# Patient Record
Sex: Female | Born: 1955 | Race: Black or African American | Hispanic: No | Marital: Married | Smoking: Former smoker
Health system: Southern US, Community
[De-identification: ages and names within clinical notes are randomized; demographics above are authoritative.]

## PROBLEM LIST (undated history)

## (undated) DIAGNOSIS — C801 Malignant (primary) neoplasm, unspecified: Secondary | ICD-10-CM

## (undated) DIAGNOSIS — N289 Disorder of kidney and ureter, unspecified: Secondary | ICD-10-CM

## (undated) DIAGNOSIS — C50919 Malignant neoplasm of unspecified site of unspecified female breast: Secondary | ICD-10-CM

## (undated) DIAGNOSIS — K219 Gastro-esophageal reflux disease without esophagitis: Secondary | ICD-10-CM

## (undated) DIAGNOSIS — Z923 Personal history of irradiation: Secondary | ICD-10-CM

## (undated) DIAGNOSIS — G4730 Sleep apnea, unspecified: Secondary | ICD-10-CM

## (undated) HISTORY — PX: MASTECTOMY PARTIAL / LUMPECTOMY: SUR851

## (undated) HISTORY — DX: Malignant neoplasm of unspecified site of unspecified female breast: C50.919

## (undated) HISTORY — DX: Personal history of irradiation: Z92.3

## (undated) HISTORY — PX: LAPAROSCOPIC GASTRIC BANDING: SHX1100

## (undated) HISTORY — PX: COLONOSCOPY: SHX174

---

## 1997-05-21 ENCOUNTER — Emergency Department (HOSPITAL_COMMUNITY): Admission: EM | Admit: 1997-05-21 | Discharge: 1997-05-21 | Payer: Self-pay | Admitting: Emergency Medicine

## 1999-08-23 ENCOUNTER — Other Ambulatory Visit: Admission: RE | Admit: 1999-08-23 | Discharge: 1999-08-23 | Payer: Self-pay | Admitting: Obstetrics and Gynecology

## 1999-09-14 ENCOUNTER — Encounter: Admission: RE | Admit: 1999-09-14 | Discharge: 1999-12-13 | Payer: Self-pay | Admitting: Obstetrics and Gynecology

## 2000-09-28 ENCOUNTER — Other Ambulatory Visit: Admission: RE | Admit: 2000-09-28 | Discharge: 2000-09-28 | Payer: Self-pay | Admitting: Obstetrics and Gynecology

## 2001-10-11 ENCOUNTER — Other Ambulatory Visit: Admission: RE | Admit: 2001-10-11 | Discharge: 2001-10-11 | Payer: Self-pay | Admitting: Obstetrics and Gynecology

## 2003-05-01 ENCOUNTER — Other Ambulatory Visit: Admission: RE | Admit: 2003-05-01 | Discharge: 2003-05-01 | Payer: Self-pay | Admitting: Obstetrics and Gynecology

## 2004-01-08 ENCOUNTER — Ambulatory Visit (HOSPITAL_COMMUNITY): Admission: RE | Admit: 2004-01-08 | Discharge: 2004-01-08 | Payer: Self-pay | Admitting: General Surgery

## 2004-01-13 ENCOUNTER — Encounter: Admission: RE | Admit: 2004-01-13 | Discharge: 2004-04-12 | Payer: Self-pay | Admitting: General Surgery

## 2004-06-02 ENCOUNTER — Other Ambulatory Visit: Admission: RE | Admit: 2004-06-02 | Discharge: 2004-06-02 | Payer: Self-pay | Admitting: Obstetrics and Gynecology

## 2004-06-10 ENCOUNTER — Encounter: Admission: RE | Admit: 2004-06-10 | Discharge: 2004-09-08 | Payer: Self-pay | Admitting: General Surgery

## 2004-06-17 ENCOUNTER — Ambulatory Visit: Payer: Self-pay | Admitting: Internal Medicine

## 2004-07-08 ENCOUNTER — Ambulatory Visit: Payer: Self-pay | Admitting: Internal Medicine

## 2004-07-25 ENCOUNTER — Inpatient Hospital Stay (HOSPITAL_COMMUNITY): Admission: RE | Admit: 2004-07-25 | Discharge: 2004-07-28 | Payer: Self-pay | Admitting: General Surgery

## 2004-10-21 ENCOUNTER — Encounter: Admission: RE | Admit: 2004-10-21 | Discharge: 2005-01-15 | Payer: Self-pay | Admitting: General Surgery

## 2005-02-27 ENCOUNTER — Encounter: Admission: RE | Admit: 2005-02-27 | Discharge: 2005-05-28 | Payer: Self-pay | Admitting: General Surgery

## 2005-10-27 ENCOUNTER — Other Ambulatory Visit: Admission: RE | Admit: 2005-10-27 | Discharge: 2005-10-27 | Payer: Self-pay | Admitting: Obstetrics and Gynecology

## 2009-06-29 ENCOUNTER — Encounter (INDEPENDENT_AMBULATORY_CARE_PROVIDER_SITE_OTHER): Payer: Self-pay | Admitting: *Deleted

## 2010-02-06 ENCOUNTER — Encounter: Payer: Self-pay | Admitting: General Surgery

## 2010-02-17 NOTE — Letter (Signed)
Summary: Colonoscopy Letter  [facility]  [address] [place], [place] [address]   Phone: [phone]  Fax: [phone]      June 29, 2009 MRN: [id number]   [facility] [address] [place], [place]  [address]   Dear Ms. Davidow,   According to your medical record, it is time for you to schedule a Colonoscopy. The American Cancer Society recommends this procedure as a method to detect early colon cancer. Patients with a family history of colon cancer, or a personal history of colon polyps or inflammatory bowel disease are at increased risk.  This letter has been generated based on the recommendations made at the time of your procedure. If you feel that in your particular situation this may no longer apply, please contact our office.  Please call our office at [phone] to schedule this appointment or to update your records at your earliest convenience.  Thank you for cooperating with [place] to provide you with the very best care possible.   Sincerely,  Wilhemina Bonito. Marina Goodell, M.D.  [facility] [phone]

## 2010-10-07 ENCOUNTER — Encounter: Payer: Self-pay | Admitting: Internal Medicine

## 2010-11-10 ENCOUNTER — Other Ambulatory Visit: Payer: Self-pay | Admitting: Internal Medicine

## 2011-02-27 ENCOUNTER — Telehealth: Payer: Self-pay | Admitting: *Deleted

## 2011-02-27 NOTE — Telephone Encounter (Signed)
I called the patient and asked her if she wanted to reschedule the colonoscopy . She had one scheduled for 11-10-2010 with Dr. Marina Goodell.  She said she had the colonoscopy done in December, 2012.  I looked in our system and didn't see it.  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   I asked her the doctor's name and she said thank you and hung up.

## 2011-04-14 ENCOUNTER — Telehealth: Payer: Self-pay | Admitting: *Deleted

## 2011-04-14 NOTE — Telephone Encounter (Signed)
Called patient to schedule an appt w/ Dr. Welton Flakes and she states that she is scheduled for another Lumpectomy on 4/10 and we need to wait.  She also stated that she has [employer] and it requires approval to come here.  Told pt that I would get with our Financial Counseling Dept about the ins. And I would call her after 4/10 to schedule her Med Onc appt.

## 2011-04-17 DIAGNOSIS — C50919 Malignant neoplasm of unspecified site of unspecified female breast: Secondary | ICD-10-CM

## 2011-04-17 HISTORY — DX: Malignant neoplasm of unspecified site of unspecified female breast: C50.919

## 2011-04-24 ENCOUNTER — Encounter: Payer: Self-pay | Admitting: Oncology

## 2011-04-24 NOTE — Progress Notes (Signed)
I spoke with Florentina Addison at [facility] on Friday and she told me that we would have to go through the patient primary car doctor to get the referral for her to come her,so I talk with Burna Mortimer at Dr,[place] office and she said she sent in the referral for the patient ,but when it came back the said she would only be cover at the hematology in [place],and also I spoke with the patient to let her know what's was going on.

## 2011-05-15 ENCOUNTER — Ambulatory Visit
Admission: RE | Admit: 2011-05-15 | Discharge: 2011-05-15 | Disposition: A | Discharge status: Home / Self Care | Source: Ambulatory Visit | Attending: Radiation Oncology | Admitting: Radiation Oncology

## 2011-05-15 ENCOUNTER — Ambulatory Visit

## 2011-05-15 ENCOUNTER — Encounter: Payer: Self-pay | Admitting: Radiation Oncology

## 2011-05-15 ENCOUNTER — Ambulatory Visit: Admitting: Radiation Oncology

## 2011-05-15 VITALS — BP 145/82 | HR 70 | Temp 98.9°F | Resp 18 | Ht 69.0 in | Wt 269.6 lb

## 2011-05-15 DIAGNOSIS — G4730 Sleep apnea, unspecified: Secondary | ICD-10-CM | POA: Insufficient documentation

## 2011-05-15 DIAGNOSIS — C50119 Malignant neoplasm of central portion of unspecified female breast: Secondary | ICD-10-CM

## 2011-05-15 DIAGNOSIS — K219 Gastro-esophageal reflux disease without esophagitis: Secondary | ICD-10-CM | POA: Insufficient documentation

## 2011-05-15 DIAGNOSIS — Z87891 Personal history of nicotine dependence: Secondary | ICD-10-CM | POA: Insufficient documentation

## 2011-05-15 DIAGNOSIS — C50419 Malignant neoplasm of upper-outer quadrant of unspecified female breast: Secondary | ICD-10-CM | POA: Insufficient documentation

## 2011-05-15 DIAGNOSIS — D0590 Unspecified type of carcinoma in situ of unspecified breast: Secondary | ICD-10-CM | POA: Insufficient documentation

## 2011-05-15 DIAGNOSIS — Z510 Encounter for antineoplastic radiation therapy: Secondary | ICD-10-CM | POA: Insufficient documentation

## 2011-05-15 DIAGNOSIS — L988 Other specified disorders of the skin and subcutaneous tissue: Secondary | ICD-10-CM | POA: Insufficient documentation

## 2011-05-15 DIAGNOSIS — D0511 Intraductal carcinoma in situ of right breast: Secondary | ICD-10-CM

## 2011-05-15 DIAGNOSIS — Z9884 Bariatric surgery status: Secondary | ICD-10-CM | POA: Insufficient documentation

## 2011-05-15 HISTORY — DX: Gastro-esophageal reflux disease without esophagitis: K21.9

## 2011-05-15 HISTORY — DX: Sleep apnea, unspecified: G47.30

## 2011-05-15 HISTORY — DX: Malignant (primary) neoplasm, unspecified: C80.1

## 2011-05-15 NOTE — Progress Notes (Signed)
Patient for consultation of newly diagnosed right breast (dcis) with comedo necrosis.Patient is ERPR+.Menarche at age 56, first child age 47.No HRT replacement, menopause at age 57.

## 2011-05-15 NOTE — Progress Notes (Signed)
Radiation Oncology         [phone]) [phone] ________________________________  Initial outpatient Consultation  Name: Toni Randolph MRN: [id number]  Date: 05/15/2011  DOB: [birth year]  CC:No primary provider on file.  Tresa Res., MD   REFERRING PHYSICIAN: Tresa Res., MD  DIAGNOSIS: High Grade Intraductal Right Breast Cancer  HISTORY OF PRESENT ILLNESS:                                                                                                                                                                                                                                                                                                                                     :Toni Randolph is a 56 y.o. female who is seen courtesy of Dr. Allyne Gee for an opinion concerning radiation therapy as part of management of the patient's right intraductal breast cancer. Earlier this year on screening mammography the patient was noted to have some calcifications within the central aspect of the right breast. 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 The patient's initial biopsy specimen did show solid and comedo subtypes associated cost calcifications. The tumor was high to intermediate grade. This initial biopsy was performed in [facility]. This did have a MRI which revealed a 2.2 x 1.6 x 3.5 cm centimeter area of non mass enhancement. This patient was seen by Dr. Burnis Kingfisher at [facility] for consultation. She eventually underwent surgery with Dr. Dorette Grate.  on March 25 patient underwent a right partial mastectomy and sentinel node procedure. in the [facility] area where her husband works. 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 The patient's sentinel node showed no evidence of malignancy. The surgical margins were close at less than 0.5 mm from the inked margin. She subsequently underwent a  reexcision April 12 showing no residual disease within the  right operative site.  Given the patient's residence in the [facility] area she wishes to be treated at [place].  PREVIOUS RADIATION THERAPY: No  PAST MEDICAL HISTORY:  has a past medical history of GERD (gastroesophageal reflux disease); Cancer; and Sleep apnea.    PAST SURGICAL HISTORY: Past Surgical History  Procedure Date  . Colonoscopy   . Laparoscopic gastric banding   . Mastectomy partial / lumpectomy     FAMILY HISTORY: family history includes Coronary artery disease in an unspecified family member and Diabetes in an unspecified family member.  SOCIAL HISTORY:  reports that she quit smoking about 30 years ago. Her smoking use included Cigarettes. She smoked .2 packs per day. She has never used smokeless tobacco. She reports that she does not drink alcohol or use illicit drugs.  ALLERGIES: Codeine  MEDICATIONS:  Current Outpatient Prescriptions  Medication Sig Dispense Refill  . dexlansoprazole (DEXILANT) 60 MG capsule Take 60 mg by mouth daily.        REVIEW OF SYSTEMS:  A 15 point review of systems is documented in the electronic medical record. This was obtained by the nursing staff. However, I reviewed this with the patient to discuss relevant findings and make appropriate changes.  She does complain of some discomfort in the right breast since her reexcision.   PHYSICAL EXAM:  height is 5\' 9"  (1.753 m) and weight is 269 lb 9.6 oz (122.29 kg). Her oral temperature is 98.9 F (37.2 C). Her blood pressure is 145/82 and her pulse is 70. Her respiration is 18.   Examination of the pupils reveals him to be equal round and react to light. The extraocular movements are intact. The tongue is midline. There is no secondary infection noted in the oral cavity or posterior pharynx. Patient's teeth are in good repair. Examination of the neck and supraclavicular region reveals no evidence of adenopathy. Axillary areas are free  of adenopathy. The  lungs reveals them to be clear. The heart has a regular rhythm and rate. Examination of the left breast reveals that the large and somewhat pendulous without mass or nipple discharge. Examination of right breast with some bruising laterally. Patient has a well healing scar in the lateral aspect of the breast this is located in the 8:30 to 9:00 position of the breast. Examination the abdomen reveals to be soft and nontender with normal bowel sounds. Patient has a small scar from her previous  lap band procedure.  Motor strength is 5 out of 5 in the proximal and distal muscle groups of the upper and lower extremities. Peripheral pulses are good. There is mild edema in the ankle and foot areas. There is no appreciable swelling in the right arm.  LABORATORY DATA:  No results found for this basename: WBC, HGB, HCT, MCV, PLT   No results found for this basename: NA, K, CL, CO2   No results found for this basename: ALT, AST, GGT, ALKPHOS, BILITOT     RADIOGRAPHY: No results found.    IMPRESSION: High-grade intraductal carcinoma of the right breast. Patient would be a good candidate for breast conserving therapy with radiation therapy directed to the right breast. I discussed the overall treatment course side effects and potential toxicities of radiation therapy in the situation with Mrs. Derryberry. She appears to understand and wishes to proceed with planned course of treatment. Anticipate a dose of approximately 5040 cGy directed the right breast. Given the negative reexcision the patient will not require a boost field.  PLAN: Simulation and planning will occur on May 6 with treatment to begin the week of May 13. This should allow adequate time for healing from her recent reexcision.  I spent 60 minutes minutes face to face with the patient and more than 50% of that time was spent in counseling and/or coordination of care.   ------------------------------------------------  Billie Lade, M.D.

## 2011-05-15 NOTE — Progress Notes (Signed)
HERE TODAY FOR CONSULT RIGHT BREAST.  CONTINUES TO HAVE DISCOMFORT AT OP SITE, THE SHARPE , SHOOTING PAINS.

## 2011-05-17 ENCOUNTER — Ambulatory Visit
Admission: RE | Admit: 2011-05-17 | Discharge: 2011-05-17 | Disposition: A | Discharge status: Home / Self Care | Source: Ambulatory Visit | Attending: Radiation Oncology | Admitting: Radiation Oncology

## 2011-05-17 DIAGNOSIS — D0511 Intraductal carcinoma in situ of right breast: Secondary | ICD-10-CM

## 2011-05-17 NOTE — Progress Notes (Signed)
  Radiation Oncology         [phone] ________________________________  Name: Toni Randolph MRN: [id number]  Date: 05/17/2011  DOB: 03/21/1955  SIMULATION AND TREATMENT PLANNING NOTE  DIAGNOSIS:  Intraductal right breast cancer  NARRATIVE:  The patient was brought to the CT Simulation planning suite.  Identity was confirmed.  All relevant records and images related to the planned course of therapy were reviewed.  The patient freely provided informed written consent to proceed with treatment after reviewing the details related to the planned course of therapy. The consent form was witnessed and verified by the simulation staff.  Then, the patient was set-up in a stable reproducible  supine position for radiation therapy.  CT images were obtained.  Surface markings were placed.  The CT images were loaded into the planning software.  Then the target and avoidance structures were contoured.  Treatment planning then occurred.  The radiation prescription was entered and confirmed.  A total of 3 complex treatment devices were fabricated (custom neck and 2 MLC's). I have requested : Isodose Plan.    PLAN:  The patient will receive 50.4 Gy in 28 fractions.  -----------------------------------  Billie Lade, PhD, MD

## 2011-05-17 NOTE — Progress Notes (Signed)
Met with patient to discuss RO billing.  Gave patient several handouts and discuss pre-cert for [facility].  Patient had no other concerns today.

## 2011-05-19 ENCOUNTER — Encounter: Payer: Self-pay | Admitting: *Deleted

## 2011-05-19 ENCOUNTER — Encounter: Admitting: *Deleted

## 2011-05-22 ENCOUNTER — Other Ambulatory Visit: Payer: Self-pay | Admitting: *Deleted

## 2011-05-22 ENCOUNTER — Other Ambulatory Visit: Payer: Self-pay | Admitting: Radiation Oncology

## 2011-05-22 DIAGNOSIS — C50919 Malignant neoplasm of unspecified site of unspecified female breast: Secondary | ICD-10-CM

## 2011-05-24 ENCOUNTER — Ambulatory Visit: Admitting: Radiation Oncology

## 2011-05-24 ENCOUNTER — Other Ambulatory Visit: Admitting: Lab

## 2011-05-24 DIAGNOSIS — C50919 Malignant neoplasm of unspecified site of unspecified female breast: Secondary | ICD-10-CM

## 2011-05-24 LAB — RESEARCH LABS

## 2011-05-26 ENCOUNTER — Ambulatory Visit
Admission: RE | Admit: 2011-05-26 | Discharge: 2011-05-26 | Disposition: A | Discharge status: Home / Self Care | Source: Ambulatory Visit | Attending: Radiation Oncology | Admitting: Radiation Oncology

## 2011-05-26 ENCOUNTER — Encounter: Payer: Self-pay | Admitting: Radiation Oncology

## 2011-05-26 ENCOUNTER — Encounter: Payer: Self-pay | Admitting: *Deleted

## 2011-05-26 NOTE — Progress Notes (Unsigned)
05/26/11 @ 0815, [id number], Baseline Photos and Questionnaires:  Toni Randolph into the [facility], Radiation Oncology Dept for simulation prior to starting her radiation treatment to the right breast.  She returned all of her completed questionnaires and we took photos of the three required views.  She had research labs collected on 05/24/11.  Confirmed with Toni Randolph that she has not had BRACA testing done at any time

## 2011-05-29 NOTE — Progress Notes (Signed)
  Radiation Oncology         [phone] ________________________________  Name: Toni Randolph MRN: [id number]  Date: 05/26/2011  DOB: 10/09/1955  Simulation Verification Note  Status: outpatient  NARRATIVE: The patient was brought to the treatment unit and placed in the planned treatment position. The clinical setup was verified. Then port films were obtained and uploaded to the radiation oncology medical record software.  The treatment beams were carefully compared against the planned radiation fields. The position location and shape of the radiation fields was reviewed. They targeted volume of tissue appears to be appropriately covered by the radiation beams. Organs at risk appear to be excluded as planned.  Based on my personal review, I approved the simulation verification. The patient's treatment to the breast will proceed as planned.  ------------------------------------------------  Artist Pais Kathrynn Running, M.D.

## 2011-06-01 ENCOUNTER — Ambulatory Visit

## 2011-06-02 ENCOUNTER — Ambulatory Visit

## 2011-06-05 ENCOUNTER — Ambulatory Visit
Admission: RE | Admit: 2011-06-05 | Discharge: 2011-06-05 | Disposition: A | Discharge status: Home / Self Care | Source: Ambulatory Visit | Attending: Radiation Oncology | Admitting: Radiation Oncology

## 2011-06-05 ENCOUNTER — Ambulatory Visit

## 2011-06-05 DIAGNOSIS — D0511 Intraductal carcinoma in situ of right breast: Secondary | ICD-10-CM

## 2011-06-05 NOTE — Progress Notes (Signed)
HERE TODAY FOR PUT , FIRST TREATMENT.  SKIN LOOKS GOOD

## 2011-06-05 NOTE — Progress Notes (Signed)
   Department of Radiation Oncology  Phone:  [phone] Fax:        [phone]   Weekly Management Note Current Dose: 1.8  Gy  Projected Dose: 50.4  Gy   Narrative:  The patient presents for routine under treatment assessment.  Port film x-rays were reviewed.  The chart was checked.  She is tolerating treatments well without side effects at this point.  Physical Findings: The lungs are clear. The heart has a regular rhythm and rate. Examination of right breast reveals no appreciable radiation reaction at this point. There are no signs of infection in the breast.   Impression:  The patient is tolerating radiation.  Plan:  Continue treatment as planned.   -----------------------------------  Billie Lade, PhD, MD

## 2011-06-06 ENCOUNTER — Ambulatory Visit
Admission: RE | Admit: 2011-06-06 | Discharge: 2011-06-06 | Disposition: A | Discharge status: Home / Self Care | Source: Ambulatory Visit | Attending: Radiation Oncology | Admitting: Radiation Oncology

## 2011-06-07 ENCOUNTER — Ambulatory Visit
Admission: RE | Admit: 2011-06-07 | Discharge: 2011-06-07 | Disposition: A | Discharge status: Home / Self Care | Source: Ambulatory Visit | Attending: Radiation Oncology | Admitting: Radiation Oncology

## 2011-06-07 ENCOUNTER — Ambulatory Visit
Admission: RE | Admit: 2011-06-07 | Discharge: 2011-06-07 | Source: Ambulatory Visit | Attending: Radiation Oncology | Admitting: Radiation Oncology

## 2011-06-07 DIAGNOSIS — D0511 Intraductal carcinoma in situ of right breast: Secondary | ICD-10-CM

## 2011-06-07 NOTE — Progress Notes (Signed)
Encounter addended by: Tessa Lerner, RN on: 06/07/2011 10:10 AM<BR>     Documentation filed: Charges VN

## 2011-06-07 NOTE — Progress Notes (Signed)
Encounter addended by: Gerlene Burdock on: 06/07/2011  8:56 AM<BR>     Documentation filed: Charges VN

## 2011-06-08 ENCOUNTER — Ambulatory Visit
Admission: RE | Admit: 2011-06-08 | Discharge: 2011-06-08 | Disposition: A | Discharge status: Home / Self Care | Source: Ambulatory Visit | Attending: Radiation Oncology | Admitting: Radiation Oncology

## 2011-06-08 MED ORDER — ALRA NON-METALLIC DEODORANT (RAD-ONC): 1.0000 "application " | Freq: Once | TOPICAL | Status: DC

## 2011-06-08 MED ORDER — RADIAPLEXRX EX GEL: Freq: Once | CUTANEOUS | Status: DC

## 2011-06-08 NOTE — Progress Notes (Signed)
Encounter addended by: Amanda Pea, RN on: 06/08/2011 12:43 PM<BR>     Documentation filed: Visit Diagnoses

## 2011-06-08 NOTE — Progress Notes (Signed)
TEACHING DONE WITH PATIENT REGARDING RADIATION TX FOR BREAST CANCER.  PATIENT ABLE TO REPEAT TO ME WHAT I TALKED TO HER ABOUT.  RADIAPLEX AND ALRA DEODORANT GIVEN WITH INSTRUCTIONS FOR USE.  BOOKLET, RADIATION AND YOU GIVEN TO PT WITH PERTINENT INFO MARKED.

## 2011-06-09 ENCOUNTER — Ambulatory Visit

## 2011-06-13 ENCOUNTER — Ambulatory Visit: Admitting: Radiation Oncology

## 2011-06-13 ENCOUNTER — Ambulatory Visit
Admission: RE | Admit: 2011-06-13 | Discharge: 2011-06-13 | Disposition: A | Discharge status: Home / Self Care | Source: Ambulatory Visit | Attending: Radiation Oncology | Admitting: Radiation Oncology

## 2011-06-14 ENCOUNTER — Ambulatory Visit
Admission: RE | Admit: 2011-06-14 | Discharge: 2011-06-14 | Disposition: A | Discharge status: Home / Self Care | Source: Ambulatory Visit | Admitting: Radiation Oncology

## 2011-06-14 ENCOUNTER — Encounter: Payer: Self-pay | Admitting: Radiation Oncology

## 2011-06-14 ENCOUNTER — Ambulatory Visit
Admission: RE | Admit: 2011-06-14 | Discharge: 2011-06-14 | Disposition: A | Discharge status: Home / Self Care | Source: Ambulatory Visit | Attending: Radiation Oncology | Admitting: Radiation Oncology

## 2011-06-14 VITALS — BP 127/77 | HR 66 | Resp 20 | Wt 272.7 lb

## 2011-06-14 DIAGNOSIS — D0511 Intraductal carcinoma in situ of right breast: Secondary | ICD-10-CM

## 2011-06-14 NOTE — Progress Notes (Signed)
Pt reports fatigue, no loss of appetite or pain. Applying Radiaplex to right breast.  Pt noticed swelling in left lower arm x 1 week. No history. Denies pain , tingling in left arm, does states she "felt like something was crawling on inner arm".

## 2011-06-14 NOTE — Progress Notes (Signed)
   Department of Radiation Oncology  Phone:  [phone] Fax:        [phone]   Weekly Management Note Current Dose: 10.8  Gy  Projected Dose: 50.4  Gy   Narrative:  The patient presents for routine under treatment assessment.  Port film x-rays were reviewed.  The chart was checked.  She has noticed some mild fatigue. She denies any itching or discomfort in the breast area.  Physical Findings: Weight: 272 lb 11.2 oz (123.696 kg). The lungs are clear. The heart has a regular rhythm and rate. Examination of the right breast reveals no appreciable radiation reaction at this point.  Impression:  The patient is tolerating radiation.  Plan:  Continue treatment as planned.   -----------------------------------  Billie Lade, PhD, MD

## 2011-06-15 ENCOUNTER — Ambulatory Visit
Admission: RE | Admit: 2011-06-15 | Discharge: 2011-06-15 | Disposition: A | Discharge status: Home / Self Care | Source: Ambulatory Visit | Attending: Radiation Oncology | Admitting: Radiation Oncology

## 2011-06-16 ENCOUNTER — Ambulatory Visit

## 2011-06-16 NOTE — Progress Notes (Signed)
Encounter addended by: Delynn Flavin, RN on: 06/16/2011  4:43 PM<BR>     Documentation filed: Visit Diagnoses

## 2011-06-19 ENCOUNTER — Ambulatory Visit
Admission: RE | Admit: 2011-06-19 | Discharge: 2011-06-19 | Disposition: A | Discharge status: Home / Self Care | Source: Ambulatory Visit | Attending: Radiation Oncology | Admitting: Radiation Oncology

## 2011-06-20 ENCOUNTER — Ambulatory Visit
Admission: RE | Admit: 2011-06-20 | Discharge: 2011-06-20 | Disposition: A | Discharge status: Home / Self Care | Source: Ambulatory Visit | Attending: Radiation Oncology | Admitting: Radiation Oncology

## 2011-06-21 ENCOUNTER — Ambulatory Visit: Admission: RE | Admit: 2011-06-21 | Discharge: 2011-06-21 | Source: Ambulatory Visit | Admitting: Radiation Oncology

## 2011-06-21 ENCOUNTER — Ambulatory Visit
Admission: RE | Admit: 2011-06-21 | Discharge: 2011-06-21 | Disposition: A | Discharge status: Home / Self Care | Source: Ambulatory Visit | Attending: Radiation Oncology | Admitting: Radiation Oncology

## 2011-06-21 DIAGNOSIS — D0511 Intraductal carcinoma in situ of right breast: Secondary | ICD-10-CM

## 2011-06-21 MED ORDER — LORAZEPAM 0.5 MG PO TABS: 0.5000 mg | ORAL_TABLET | Freq: Three times a day (TID) | ORAL | Status: AC

## 2011-06-21 NOTE — Progress Notes (Signed)
Weekly Management Note Current Dose:  18.0 Gy  Projected Dose: 50.4  Gy   Narrative:  The patient presents for routine under treatment assessment. Port film x-rays were reviewed.  The chart was checked. She has noticed some mild fatigue but continues to work her usual schedule. Patient denies any significant itching or discomfort in the breast. She does have some discomfort in the neck area with her position for treatment. This is  bothering her up into the evening hours making sleeping difficult. I suggested a possible muscle relaxant. The patient would like to try Ativan to see if this will help.   Physical Findings: The lungs are clear. The heart has a regular rhythm and rate. Examination of the right breast area reveals some slight hyperpigmentation changes and mild erythema  in the inframammary fold.  Impression:  The patient is tolerating radiation well except for positional issues as above. Patient will try Ativan as above see if this will help with her neck discomfort.  Plan:  Continue treatment as planned.   -----------------------------------  Billie Lade, PhD, MD

## 2011-06-21 NOTE — Progress Notes (Signed)
HERE FOR PUT TODAY OF RIGHT BREAST.  SKIN GOOD.  DOES C/O PAIN 4/10 IN RIGHT LATERAL NECK FROM [place] FOR [place]

## 2011-06-22 ENCOUNTER — Ambulatory Visit

## 2011-06-23 ENCOUNTER — Ambulatory Visit
Admission: RE | Admit: 2011-06-23 | Discharge: 2011-06-23 | Disposition: A | Discharge status: Home / Self Care | Source: Ambulatory Visit | Attending: Radiation Oncology | Admitting: Radiation Oncology

## 2011-06-26 ENCOUNTER — Ambulatory Visit
Admission: RE | Admit: 2011-06-26 | Discharge: 2011-06-26 | Disposition: A | Discharge status: Home / Self Care | Source: Ambulatory Visit | Attending: Radiation Oncology | Admitting: Radiation Oncology

## 2011-06-26 NOTE — Progress Notes (Signed)
Encounter addended by: Tessa Lerner, RN on: 06/26/2011  4:31 PM<BR>     Documentation filed: Charges VN

## 2011-06-27 ENCOUNTER — Ambulatory Visit
Admission: RE | Admit: 2011-06-27 | Discharge: 2011-06-27 | Disposition: A | Discharge status: Home / Self Care | Source: Ambulatory Visit | Attending: Radiation Oncology | Admitting: Radiation Oncology

## 2011-06-28 ENCOUNTER — Ambulatory Visit

## 2011-06-29 ENCOUNTER — Encounter: Payer: Self-pay | Admitting: *Deleted

## 2011-06-29 ENCOUNTER — Ambulatory Visit

## 2011-06-29 NOTE — Progress Notes (Signed)
[facility] Psychosocial Distress Screening Clinical Social Work  Clinical Social Work was referred by distress screening protocol.  The patient scored a 7 on the Psychosocial Distress Thermometer which indicates moderate distress. Clinical Social Worker attempted to contact patient to assess for distress and other psychosocial needs. CSW left voicemail requesting patient return call.     Kathrin Penner, MSW, [facility] Clinical Social Worker [facility] [phone]

## 2011-06-30 ENCOUNTER — Ambulatory Visit

## 2011-07-03 ENCOUNTER — Ambulatory Visit
Admission: RE | Admit: 2011-07-03 | Discharge: 2011-07-03 | Disposition: A | Discharge status: Home / Self Care | Source: Ambulatory Visit | Attending: Radiation Oncology | Admitting: Radiation Oncology

## 2011-07-03 DIAGNOSIS — D0590 Unspecified type of carcinoma in situ of unspecified breast: Secondary | ICD-10-CM

## 2011-07-03 MED ORDER — BIAFINE EX EMUL
CUTANEOUS | Status: DC | PRN
  Administered 2011-07-03: 19:00:00 via TOPICAL

## 2011-07-04 ENCOUNTER — Ambulatory Visit
Admission: RE | Admit: 2011-07-04 | Discharge: 2011-07-04 | Disposition: A | Discharge status: Home / Self Care | Source: Ambulatory Visit | Attending: Radiation Oncology | Admitting: Radiation Oncology

## 2011-07-04 VITALS — BP 116/73 | HR 61 | Temp 98.4°F

## 2011-07-04 DIAGNOSIS — C50419 Malignant neoplasm of upper-outer quadrant of unspecified female breast: Secondary | ICD-10-CM

## 2011-07-04 MED ORDER — BIAFINE EX EMUL
CUTANEOUS | Status: DC | PRN
  Administered 2011-07-04: 1 via TOPICAL

## 2011-07-04 NOTE — Progress Notes (Signed)
Burning sensation lower right axillary region.  Hyperpigmentation noted in this area and in the inframmary fold.  Skin intact presently.  Small red bumps inframmary fold and in the upper inner portion of her tx. Field.  Reports fatigue.  Continues to work full-time Monday thru Thursday.

## 2011-07-04 NOTE — Progress Notes (Signed)
   Department of Radiation Oncology  Phone:  [phone] Fax:        [phone]   Weekly Management Note Current Dose: 27.0  Gy  Projected Dose: 50.4 Gy   Narrative:  The patient presents for routine under treatment assessment.  Port film x-rays were reviewed.  The chart was checked.  She is starting to have some fatigue at this time. This is also noticed some discomfort as well as itching within the treatment area  Physical Findings: The lungs are clear. The heart has a regular rhythm and rate. Summation right breast reveals hyperpigmentation changes and erythema. There is impending moist desquamation in the inframammary fold and upper outer aspect of the breast.  Impression:  The patient is tolerating radiation.  Plan:  Continue treatment as planned. Patient is using Biafine for her skin. Also advised her to use hydrocortisone cream if the itching gets out of hand. She  Also has been given a hydrogel dressing to help with discomfort issues.  -----------------------------------  Billie Lade, PhD, MD

## 2011-07-04 NOTE — Addendum Note (Signed)
Encounter addended by: Delynn Flavin, RN on: 07/04/2011  7:46 PM<BR>     Documentation filed: Orders

## 2011-07-04 NOTE — Addendum Note (Signed)
Encounter addended by: Delynn Flavin, RN on: 07/04/2011  7:57 PM<BR>     Documentation filed: Inpatient MAR

## 2011-07-05 ENCOUNTER — Ambulatory Visit
Admission: RE | Admit: 2011-07-05 | Discharge: 2011-07-05 | Disposition: A | Discharge status: Home / Self Care | Source: Ambulatory Visit | Attending: Radiation Oncology | Admitting: Radiation Oncology

## 2011-07-06 ENCOUNTER — Ambulatory Visit
Admission: RE | Admit: 2011-07-06 | Discharge: 2011-07-06 | Disposition: A | Discharge status: Home / Self Care | Source: Ambulatory Visit | Attending: Radiation Oncology | Admitting: Radiation Oncology

## 2011-07-07 ENCOUNTER — Ambulatory Visit
Admission: RE | Admit: 2011-07-07 | Discharge: 2011-07-07 | Disposition: A | Discharge status: Home / Self Care | Source: Ambulatory Visit | Attending: Radiation Oncology | Admitting: Radiation Oncology

## 2011-07-10 ENCOUNTER — Ambulatory Visit
Admission: RE | Admit: 2011-07-10 | Discharge: 2011-07-10 | Disposition: A | Discharge status: Home / Self Care | Source: Ambulatory Visit | Attending: Radiation Oncology | Admitting: Radiation Oncology

## 2011-07-11 ENCOUNTER — Encounter: Payer: Self-pay | Admitting: Radiation Oncology

## 2011-07-11 ENCOUNTER — Ambulatory Visit
Admission: RE | Admit: 2011-07-11 | Discharge: 2011-07-11 | Disposition: A | Discharge status: Home / Self Care | Source: Ambulatory Visit | Attending: Radiation Oncology | Admitting: Radiation Oncology

## 2011-07-11 VITALS — BP 118/82 | HR 67 | Resp 18 | Wt 271.1 lb

## 2011-07-11 DIAGNOSIS — C50419 Malignant neoplasm of upper-outer quadrant of unspecified female breast: Secondary | ICD-10-CM

## 2011-07-11 NOTE — Progress Notes (Signed)
Weekly Management Note Current Dose: 34.2  Gy  Projected Dose: 50.4 Gy   Narrative:  The patient presents for routine under treatment assessment.  CBCT/MVCT images/Port film x-rays were reviewed.  The chart was checked. Doing well after a few days off. Neck is much improved.  Skin is irritated but improved with hydrogel and biafene.  Physical Findings: Weight: 271 lb 1.6 oz (122.97 kg). Dry desquamation in axilla and inframammary folds. Rest of breast is slightly dark.  Impression:  The patient is tolerating radiation.  Plan:  Continue treatment as planned. Continue biafene and hydrogel. Discussed keeping inframammary folds dry.

## 2011-07-11 NOTE — Progress Notes (Signed)
Patient presents to the clinic today unaccompanied for an under treat visit with Dr. Michell Heinrich. Patient is alert and oriented to person, place, and time. No distress noted. Steady gait noted. Pleasant affect noted. Patient denies pain today. Patient reports neck pain has greatly improved following heat application and three day break. Patient reports using hydrogel pad and biafine as directed. Hyperpigmentation with dry desquamation noted at axilla and under breast. Reported all findings to Dr. Michell Heinrich.

## 2011-07-12 ENCOUNTER — Other Ambulatory Visit: Payer: Self-pay | Admitting: *Deleted

## 2011-07-12 ENCOUNTER — Ambulatory Visit
Admission: RE | Admit: 2011-07-12 | Discharge: 2011-07-12 | Disposition: A | Discharge status: Home / Self Care | Source: Ambulatory Visit | Attending: Radiation Oncology | Admitting: Radiation Oncology

## 2011-07-12 DIAGNOSIS — C50919 Malignant neoplasm of unspecified site of unspecified female breast: Secondary | ICD-10-CM

## 2011-07-13 ENCOUNTER — Ambulatory Visit
Admission: RE | Admit: 2011-07-13 | Discharge: 2011-07-13 | Disposition: A | Discharge status: Home / Self Care | Source: Ambulatory Visit | Attending: Radiation Oncology | Admitting: Radiation Oncology

## 2011-07-14 ENCOUNTER — Ambulatory Visit
Admission: RE | Admit: 2011-07-14 | Discharge: 2011-07-14 | Disposition: A | Discharge status: Home / Self Care | Source: Ambulatory Visit | Attending: Radiation Oncology | Admitting: Radiation Oncology

## 2011-07-17 ENCOUNTER — Ambulatory Visit
Admission: RE | Admit: 2011-07-17 | Discharge: 2011-07-17 | Disposition: A | Discharge status: Home / Self Care | Source: Ambulatory Visit | Attending: Radiation Oncology | Admitting: Radiation Oncology

## 2011-07-18 ENCOUNTER — Ambulatory Visit
Admission: RE | Admit: 2011-07-18 | Discharge: 2011-07-18 | Disposition: A | Discharge status: Home / Self Care | Source: Ambulatory Visit | Attending: Radiation Oncology | Admitting: Radiation Oncology

## 2011-07-18 ENCOUNTER — Encounter: Payer: Self-pay | Admitting: Radiation Oncology

## 2011-07-18 VITALS — BP 122/81 | HR 66 | Temp 98.1°F | Wt 273.9 lb

## 2011-07-18 DIAGNOSIS — C50419 Malignant neoplasm of upper-outer quadrant of unspecified female breast: Secondary | ICD-10-CM

## 2011-07-18 NOTE — Progress Notes (Signed)
Dry desquamation right inframmary fold.   Hyperpigmentation noted. Intermittent pain

## 2011-07-18 NOTE — Progress Notes (Signed)
   Department of Radiation Oncology  Phone:  [phone] Fax:        [phone]    Weekly Management Note Current Dose:45.0 Gy  Projected Dose:50.4 Gy   Narrative:  The patient presents for routine under treatment assessment.  CBCT/MVCT images/Port film x-rays were reviewed.  The chart was checked. She is having discomfort in the breast particularly the inframammary fold this is also noticed some itching. Patient is using Biafine which is helpful.  Physical Findings:  The right breast is hyperpigmented. There is no moist desquamation noted within the breast at this time.  Vitals:  Filed Vitals:   07/18/11 1759  BP: 122/81  Pulse: 66  Temp: 98.1 F (36.7 C)   Weight:  Wt Readings from Last 3 Encounters:  07/18/11 273 lb 14.4 oz (124.24 kg)  07/11/11 271 lb 1.6 oz (122.97 kg)  06/21/11 270 lb (122.471 kg)   No results found for this basename: WBC, HGB, HCT, MCV, PLT   No results found for this basename: CREATININE, BUN, NA, K, CL, CO2     Impression:  The patient is tolerating radiation.  Plan:  Continue treatment as planned. -----------------------------------  Billie Lade, PhD, MD

## 2011-07-19 ENCOUNTER — Ambulatory Visit
Admission: RE | Admit: 2011-07-19 | Discharge: 2011-07-19 | Disposition: A | Discharge status: Home / Self Care | Source: Ambulatory Visit | Attending: Radiation Oncology | Admitting: Radiation Oncology

## 2011-07-21 ENCOUNTER — Ambulatory Visit

## 2011-07-24 ENCOUNTER — Ambulatory Visit

## 2011-07-25 ENCOUNTER — Ambulatory Visit
Admission: RE | Admit: 2011-07-25 | Discharge: 2011-07-25 | Disposition: A | Discharge status: Home / Self Care | Source: Ambulatory Visit | Attending: Radiation Oncology | Admitting: Radiation Oncology

## 2011-07-25 ENCOUNTER — Other Ambulatory Visit: Admitting: Lab

## 2011-07-25 DIAGNOSIS — C50419 Malignant neoplasm of upper-outer quadrant of unspecified female breast: Secondary | ICD-10-CM

## 2011-07-25 NOTE — Progress Notes (Signed)
HERE TODAY FOR PUT OF RIGHT BREAST. STILL HAS PAIN HER NECK FROM TURNING IT SO MUCH WITH TX, TAKES ADVIL FOR THIS.  SIN IS DARKER WITH SOME DRY DESQUAMATION NOTED UNDER BREAST AND AROUND NIPPLE , USING BIAFINE.

## 2011-07-25 NOTE — Progress Notes (Signed)
Weekly Management Note Current Dose: 48.6  Gy  Projected Dose: 50.4  Gy   Narrative:  The patient presents for routine under treatment assessment.  CBCT/MVCT images/Port film x-rays were reviewed.  The chart was checked.  She is having some discomfort in the neck area from her position set up. This clears up over the weekend however.  She is also noticed some discomfort in the inframammary fold area on the right side.  Physical Findings: The lungs are clear. The heart has regular rhythm and rate. Examination right breast reveals hyperpigmentation changes and some erythema. The erythema is typically noticed in the inframammary fold but no moist desquamation is noted. The patient may possibly have a fungal infection in this area.  Impression:  The patient is tolerating radiation.    Plan:  Continue treatment as planned. She will return on Friday for her last treatment with her family to celebrate completion of her treatment. Given the potential for a fungal infection in the inframammary fold area the patient is given antifungal powder to use of the next 2 days.

## 2011-07-28 ENCOUNTER — Encounter: Payer: Self-pay | Admitting: *Deleted

## 2011-07-28 ENCOUNTER — Encounter: Payer: Self-pay | Admitting: Radiation Oncology

## 2011-07-28 ENCOUNTER — Ambulatory Visit
Admission: RE | Admit: 2011-07-28 | Discharge: 2011-07-28 | Disposition: A | Discharge status: Home / Self Care | Source: Ambulatory Visit | Attending: Radiation Oncology | Admitting: Radiation Oncology

## 2011-07-28 DIAGNOSIS — C50419 Malignant neoplasm of upper-outer quadrant of unspecified female breast: Secondary | ICD-10-CM

## 2011-07-28 DIAGNOSIS — D0590 Unspecified type of carcinoma in situ of unspecified breast: Secondary | ICD-10-CM

## 2011-07-28 MED ORDER — BIAFINE EX EMUL
CUTANEOUS | Status: DC | PRN
  Administered 2011-07-28: 1 via TOPICAL

## 2011-07-28 NOTE — Progress Notes (Signed)
07/28/11 at 12:46pm - The pt was into the cancer center this afternoon for her last radiation treatment.  The pt's photographs were taken and uploaded today.  She states that she has some right breast tenderness.  The pt's affected breast has some erythema and has hyperpigmentation changes noted.  She states she has been using the Biafine on her affected breast.  She also recently used antifungal powder in the right inframammary fold.  The research nurse completed the ONS Criteria for Radiation-Induced Acute skin reaction form (grade 2).  The pt was given her questionnaires to take home and complete over the weekend.  She was instructed to return them on Monday (07/31/11) when she comes for her end of radiation research blood and urine submission.  The pt was congratulated on her radiation completion.    07/31/11 at 1:23pm - The pt was into the cancer center this afternoon to return her completed questionnaires and to have research samples obtained.  The research nurse reviewed the questionnaires for completeness.  The pt's research blood was drawn and her urine sample was obtained.  The research assistant will ship the research samples today.  The pt is aware of her 1 month follow up visit.  The pt was thanked for her support of this study.

## 2011-07-31 ENCOUNTER — Other Ambulatory Visit: Admitting: Lab

## 2011-07-31 DIAGNOSIS — C50919 Malignant neoplasm of unspecified site of unspecified female breast: Secondary | ICD-10-CM

## 2011-07-31 LAB — RESEARCH LABS

## 2011-08-01 NOTE — Progress Notes (Incomplete)
°  Radiation Oncology         [phone] ________________________________  Name: MCKINLEE DUNK MRN: [id number]  Date: 07/28/2011  DOB: 1955-12-11  End of Treatment Note  Diagnosis:  High Grade Intraductal Right Breast Cancer   Indication for treatment:  ***       Radiation treatment dates:  06/05/2011-07/28/2011  Site/dose:   ***  Beams/energy:   ***  Narrative: The patient tolerated radiation treatment relatively well.   ***  Plan: The patient has completed radiation treatment. The patient will return to radiation oncology clinic for routine followup in one month. I advised them to call or return sooner if they have any questions or concerns related to their recovery or treatment.  -----------------------------------  Billie Lade, PhD, MD

## 2011-08-13 ENCOUNTER — Encounter: Payer: Self-pay | Admitting: Radiation Oncology

## 2011-08-13 NOTE — Progress Notes (Signed)
  Radiation Oncology         [phone] ________________________________  Name: Toni Randolph MRN: [id number]  Date: 08/13/2011  DOB: [birth year]  End of Treatment Note  Diagnosis:   High-grade intraductal carcinoma the right breast     Indication for treatment:  Breast conservation therapy       Radiation treatment dates:   06/05/2011 through 07/28/2011  Site/dose:   Right breast 5040 cGy in 28 fractions  Beams/energy:   Tangential beams with forward planning.  The combination of 6,10 and 18 MV photons were used for treatment.  Narrative: The patient tolerated radiation treatment relatively well.   She had minimal discomfort and itching within the breast area  Plan: The patient has completed radiation treatment. The patient will return to radiation oncology clinic for routine followup in one month. I advised them to call or return sooner if they have any questions or concerns related to their recovery or treatment.  -----------------------------------  Billie Lade, PhD, MD

## 2011-08-15 ENCOUNTER — Other Ambulatory Visit: Payer: Self-pay | Admitting: Radiation Oncology

## 2011-08-15 DIAGNOSIS — C50919 Malignant neoplasm of unspecified site of unspecified female breast: Secondary | ICD-10-CM

## 2011-08-16 ENCOUNTER — Telehealth: Payer: Self-pay | Admitting: *Deleted

## 2011-08-16 NOTE — Telephone Encounter (Signed)
Called patient to inform that fu appt. Has been moved to 09-04-11 at 4:00 pm, lvm for a return call

## 2011-08-17 ENCOUNTER — Encounter (INDEPENDENT_AMBULATORY_CARE_PROVIDER_SITE_OTHER): Payer: Self-pay

## 2011-08-21 ENCOUNTER — Telehealth: Payer: Self-pay | Admitting: *Deleted

## 2011-08-21 NOTE — Telephone Encounter (Signed)
Spoke with patient about scheduling her appt w/ Dr. Welton Flakes and she needs Dr. Trina Ao office to send a referral to [employer] and then she can make the appt.  Called Sam in [facility] for this request and she will notify Marylene Land to take care of.

## 2011-08-28 ENCOUNTER — Encounter: Payer: Self-pay | Admitting: Radiation Oncology

## 2011-08-28 NOTE — Progress Notes (Signed)
   Department of Radiation Oncology  Phone:  [phone] Fax:        [phone]   This patient is being referred to medical oncology for consideration for adjuvant hormonal therapy in light of her hormone receptor positive breast cancer.  She wishes to transfer her medical oncology care in light of her change in residence.  -----------------------------------  Billie Lade, PhD, MD

## 2011-09-04 ENCOUNTER — Telehealth: Payer: Self-pay | Admitting: *Deleted

## 2011-09-04 ENCOUNTER — Encounter: Payer: Self-pay | Admitting: *Deleted

## 2011-09-04 ENCOUNTER — Encounter: Payer: Self-pay | Admitting: Radiation Oncology

## 2011-09-04 ENCOUNTER — Ambulatory Visit
Admission: RE | Admit: 2011-09-04 | Discharge: 2011-09-04 | Disposition: A | Discharge status: Home / Self Care | Source: Ambulatory Visit | Attending: Radiation Oncology | Admitting: Radiation Oncology

## 2011-09-04 VITALS — BP 131/84 | HR 59 | Temp 98.0°F | Resp 18 | Wt 278.3 lb

## 2011-09-04 DIAGNOSIS — C50419 Malignant neoplasm of upper-outer quadrant of unspecified female breast: Secondary | ICD-10-CM

## 2011-09-04 DIAGNOSIS — R110 Nausea: Secondary | ICD-10-CM

## 2011-09-04 MED ORDER — ONDANSETRON HCL 8 MG PO TABS: 8.0000 mg | ORAL_TABLET | Freq: Three times a day (TID) | ORAL | Status: AC | PRN

## 2011-09-04 NOTE — Telephone Encounter (Signed)
Called [facility],spoke with Brian,pharmacist, asked if they took [employer] ins, per patient request to fill rx here, brian took rx for Zofran 8mg , 1 po q 8h prn nausea with 3 refills by Dr.Kinard, said he had other patients that had [employer] and would put it in, thanked him and called patient back at her work [phone], she will pick up  Today, asked how late they were open, informed her 730 pm  Daily M-F, thankd [place] for the call 11:56 AM

## 2011-09-04 NOTE — Progress Notes (Signed)
Patient presents to the clinic today unaccompanied for a follow up appointment with Dr. Roselind Messier. Patient is alert and oriented to person, place, and time. No distress noted. Steady gait noted. Pleasant affect noted. Patient denies pain at this time. Patient denies breast pain at this time. However, patient reports an occasional sharp shooting pain related to healing. Patient reports taking tamoxifen as directed for three weeks now. Patient reports occasional nausea associated with tamoxifen. Patient reports taking zofran for this nausea. Patient requesting refill on zofran script. Patient reports the skin of her right breast remains hyperpigmented from treatment but, she denies applying any lotion to area.Patient denies headaches or dizziness. Patient denies nipple discharge. Reported all findings to Dr. Roselind Messier.

## 2011-09-04 NOTE — Progress Notes (Signed)
09/04/11 at 9:06am - [id number] - 1 month post radiation visit- The pt was into the cancer center this am for her 1 month post radiation visit.  The pt returned her completed questionnaires.  The pt also had her photos taken and uploaded today.  The pt confirms that she is still taking her tamoxifen.  The pt denies using any skin products on her affected breast.  She denies any breast pain, but she does report some itching.  The research nurse completed the ONS Criteria for Radiation-Induced Acute Skin Radiation form (grade 1).  The pt will be seen and examined today by Dr. Roselind Messier.  The pt is aware of her month 2 post radiation appt that is due in September.  Isidoro Donning, study nurse, will coordinate this appt and notify the pt.

## 2011-09-04 NOTE — Progress Notes (Signed)
  Radiation Oncology         [phone] ________________________________  Name: Toni Randolph MRN: [id number]  Date: 09/04/2011  DOB: 1955-01-26  Follow-Up Visit Note  CC: [name]., MD  [name]., MD  Diagnosis:   High-grade intraductal carcinoma of the right breast  Interval Since Last Radiation: 1 months  Narrative:  The patient returns today for routine follow-up.  She is doing well except for some nausea associated with her tamoxifen. This began approximately a week after starting this new medication.  Patient was started on Zofran which she takes approximately once per evening for this issue.  She has an occasional discomfort in the breast area but no consistent pain. Patient denies any nipple discharge or bleeding.  The patient is in the process of switching her medical oncologist to the regional cancer Center here.                           ALLERGIES:  is allergic to codeine.  Meds: Current Outpatient Prescriptions  Medication Sig Dispense Refill  . dexlansoprazole (DEXILANT) 60 MG capsule Take 60 mg by mouth daily.      . tamoxifen (NOLVADEX) 10 MG tablet Take 10 mg by mouth 2 (two) times daily.      Marland Kitchen emollient (BIAFINE) cream Apply topically as needed.      . ondansetron (ZOFRAN) 8 MG tablet Take 1 tablet (8 mg total) by mouth every 8 (eight) hours as needed for nausea.  15 tablet  3    Physical Findings: The patient is in no acute distress. Patient is alert and oriented.  weight is 278 lb 4.8 oz (126.236 kg). Her oral temperature is 98 F (36.7 C). Her blood pressure is 131/84 and her pulse is 59. Her respiration is 18. Marland Kitchen  No palpable cervical or supraclavicular or axillary adenopathy. The lungs clear to auscultation. The heart has regular rhythm and rate. Examination of the left breast reveals no mass or nipple discharge. Examination of the right breast reveals some hyperpigmentation changes. There is some induration noted in  the right breast near  her lumpectomy scar but no dominant masses appreciated in the breast. There is no nipple discharge or bleeding.  Lab Findings: No results found for this basename: WBC, HGB, HCT, MCV, PLT    @LASTCHEM @  Radiographic Findings: No results found.  Impression:  The patient is recovering from the effects of radiation.  No evidence of recurrence on exam today. The patient has been given a prescription for Zofran in light of her nausea. I have asked her to call me in 2 weeks if she has not been set up for her medical oncology consultation here in [place].  Plan:  Routine followup in 6 months.  _____________________________________

## 2011-09-04 NOTE — Telephone Encounter (Signed)
Called patient home/cell number [phone], left vm to call us the name of pharmacy so we can call in RX of Zofran 11:47 AM

## 2011-09-06 ENCOUNTER — Ambulatory Visit: Admitting: Radiation Oncology

## 2011-09-06 ENCOUNTER — Telehealth: Payer: Self-pay | Admitting: Oncology

## 2011-09-06 NOTE — Telephone Encounter (Signed)
I called the patient after receiving a copy of her [employer] Approval and scheduled a consult with Dr. Welton Flakes 09/11/11.  The patient states she had labs elsewhere-and requested that we request them so I faxed a release form to the patient.  Will try to retrieve labs upon receipt of her signed release form.   Pt verbalized understanding.   I also faxed her the new patient letter.

## 2011-09-07 ENCOUNTER — Other Ambulatory Visit: Payer: Self-pay | Admitting: *Deleted

## 2011-09-07 DIAGNOSIS — D0510 Intraductal carcinoma in situ of unspecified breast: Secondary | ICD-10-CM

## 2011-09-11 ENCOUNTER — Ambulatory Visit

## 2011-09-11 ENCOUNTER — Encounter: Payer: Self-pay | Admitting: Oncology

## 2011-09-11 ENCOUNTER — Ambulatory Visit: Admitting: Radiation Oncology

## 2011-09-11 ENCOUNTER — Other Ambulatory Visit (HOSPITAL_BASED_OUTPATIENT_CLINIC_OR_DEPARTMENT_OTHER): Admitting: Lab

## 2011-09-11 ENCOUNTER — Ambulatory Visit (HOSPITAL_BASED_OUTPATIENT_CLINIC_OR_DEPARTMENT_OTHER): Admitting: Oncology

## 2011-09-11 VITALS — BP 130/64 | HR 62 | Temp 98.8°F | Resp 20 | Ht 68.5 in | Wt 278.2 lb

## 2011-09-11 DIAGNOSIS — D0510 Intraductal carcinoma in situ of unspecified breast: Secondary | ICD-10-CM

## 2011-09-11 DIAGNOSIS — D0590 Unspecified type of carcinoma in situ of unspecified breast: Secondary | ICD-10-CM

## 2011-09-11 DIAGNOSIS — C50419 Malignant neoplasm of upper-outer quadrant of unspecified female breast: Secondary | ICD-10-CM

## 2011-09-11 LAB — COMPREHENSIVE METABOLIC PANEL (CC13)
ALT: 15 U/L (ref 0–55)
AST: 17 U/L (ref 5–34)
Albumin: 3.9 g/dL (ref 3.5–5.0)
Alkaline Phosphatase: 72 U/L (ref 40–150)
BUN: 21 mg/dL (ref 7.0–26.0)
CO2: 26 mEq/L (ref 22–29)
Calcium: 9.9 mg/dL (ref 8.4–10.4)
Chloride: 105 mEq/L (ref 98–107)
Creatinine: 0.9 mg/dL (ref 0.6–1.1)
Glucose: 96 mg/dl (ref 70–99)
Potassium: 4 mEq/L (ref 3.5–5.1)
Sodium: 139 mEq/L (ref 136–145)
Total Bilirubin: 0.3 mg/dL (ref 0.20–1.20)
Total Protein: 7.2 g/dL (ref 6.4–8.3)

## 2011-09-11 LAB — CBC WITH DIFFERENTIAL/PLATELET
Basophils Absolute: 0 10*3/uL (ref 0.0–0.1)
Eosinophils Absolute: 0 10*3/uL (ref 0.0–0.5)
HCT: 37.8 % (ref 34.8–46.6)
HGB: 12.4 g/dL (ref 11.6–15.9)
MCH: 31.7 pg (ref 25.1–34.0)
MCHC: 32.9 g/dL (ref 31.5–36.0)
MONO#: 0.4 10*3/uL (ref 0.1–0.9)
MONO%: 7.9 % (ref 0.0–14.0)
NEUT#: 3.7 10*3/uL (ref 1.5–6.5)
Platelets: 178 10*3/uL (ref 145–400)
RBC: 3.91 10*6/uL (ref 3.70–5.45)
RDW: 16.1 % — ABNORMAL HIGH (ref 11.2–14.5)
WBC: 5.6 10*3/uL (ref 3.9–10.3)
lymph#: 1.5 10*3/uL (ref 0.9–3.3)

## 2011-09-11 MED ORDER — TAMOXIFEN CITRATE 10 MG PO TABS: 10.0000 mg | ORAL_TABLET | Freq: Two times a day (BID) | ORAL | Status: AC

## 2011-09-11 NOTE — Patient Instructions (Addendum)
Continue tamoxifen 10 mg twice a day  I will see you back in 3 months for follow up  Please call with any problems   Tamoxifen oral tablet What is this medicine? TAMOXIFEN (ta MOX i fen) blocks the effects of estrogen. It is commonly used to treat breast cancer. It is also used to decrease the chance of breast cancer coming back in women who have received treatment for the disease. It may also help prevent breast cancer in women who have a high risk of developing breast cancer. This medicine may be used for other purposes; ask your health care provider or pharmacist if you have questions. What should I tell my health care provider before I take this medicine? They need to know if you have any of these conditions: -blood clots -blood disease -cataracts or impaired eyesight -endometriosis -high calcium levels -high cholesterol -irregular menstrual cycles -liver disease -stroke -uterine fibroids -an unusual or allergic reaction to tamoxifen, other medicines, foods, dyes, or preservatives -pregnant or trying to get pregnant -breast-feeding How should I use this medicine? Take this medicine by mouth with a glass of water. Follow the directions on the prescription label. You can take it with or without food. Take your medicine at regular intervals. Do not take your medicine more often than directed. Do not stop taking except on your doctor's advice. A special MedGuide will be given to you by the pharmacist with each prescription and refill. Be sure to read this information carefully each time. Talk to your pediatrician regarding the use of this medicine in children. While this drug may be prescribed for selected conditions, precautions do apply. Overdosage: If you think you have taken too much of this medicine contact a poison control center or emergency room at once. NOTE: This medicine is only for you. Do not share this medicine with others. What if I miss a dose? If you miss a dose,  take it as soon as you can. If it is almost time for your next dose, take only that dose. Do not take double or extra doses. What may interact with this medicine? -aminoglutethimide -bromocriptine -chemotherapy drugs -female hormones, like estrogens and birth control pills -letrozole -medroxyprogesterone -phenobarbital -rifampin -warfarin This list may not describe all possible interactions. Give your health care provider a list of all the medicines, herbs, non-prescription drugs, or dietary supplements you use. Also tell them if you smoke, drink alcohol, or use illegal drugs. Some items may interact with your medicine. What should I watch for while using this medicine? Visit your doctor or health care professional for regular checks on your progress. You will need regular pelvic exams, breast exams, and mammograms. If you are taking this medicine to reduce your risk of getting breast cancer, you should know that this medicine does not prevent all types of breast cancer. If breast cancer or other problems occur, there is no guarantee that it will be found at an early stage. Do not become pregnant while taking this medicine or for 2 months after stopping this medicine. Stop taking this medicine if you get pregnant or think you are pregnant and contact your doctor. This medicine may harm your unborn baby. Women who can possibly become pregnant should use birth control methods that do not use hormones during tamoxifen treatment and for 2 months after therapy has stopped. Talk with your health care provider for birth control advice. Do not breast feed while taking this medicine. What side effects may I notice from receiving this medicine?  Side effects that you should report to your doctor or health care professional as soon as possible: -changes in vision (blurred vision) -changes in your menstrual cycle -difficulty breathing or shortness of breath -difficulty walking or talking -new breast  lumps -numbness -pelvic pain or pressure -redness, blistering, peeling or loosening of the skin, including inside the mouth -skin rash or itching (hives) -sudden chest pain -swelling of lips, face, or tongue -swelling, pain or tenderness in your calf or leg -unusual bruising or bleeding -vaginal discharge that is bloody, brown, or rust -weakness -yellowing of the whites of the eyes or skin Side effects that usually do not require medical attention (report to your doctor or health care professional if they continue or are bothersome): -fatigue -hair loss, although uncommon and is usually mild -headache -hot flashes -impotence (in men) -nausea, vomiting (mild) -vaginal discharge (white or clear) This list may not describe all possible side effects. Call your doctor for medical advice about side effects. You may report side effects to FDA at 1-800-FDA-1088. Where should I keep my medicine? Keep out of the reach of children. Store at room temperature between 20 and 25 degrees C (68 and 77 degrees F). Protect from light. Keep container tightly closed. Throw away any unused medicine after the expiration date. NOTE: This sheet is a summary. It may not cover all possible information. If you have questions about this medicine, talk to your doctor, pharmacist, or health care provider.  2012, Elsevier/Gold Standard. (09/19/2007 12:01:56 PM)

## 2011-09-11 NOTE — Progress Notes (Signed)
Toni Randolph [id number] 1955/02/08 56 y.o. 09/11/2011 4:26 PM  CC  [name]., MD [address] [place] [address] Dr. Antony Blackbird  REASON FOR CONSULTATION:  56 year old female with high-grade intraductal carcinoma of the right breast diagnosed April 2013.  STAGE:   No matching staging information was found for the patient.  REFERRING PHYSICIAN: Dr. Ivor Reining  HISTORY OF PRESENT ILLNESS:  Toni Randolph is a 56 y.o. female.  Without significant past medical history who presented early 2013 for a screening mammogram that showed some calcifications within the central aspect of the right breast. 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 The tumor was high to intermediate grade. The initial biopsy was performed in [place]. Patient had an MRI that revealed a 2.2 x 1.6 x 3.5 cm non-mass enhancement. Patient underwent surgery with Dr. Dorette Grate on March 25 she had a right partial mastectomy with sentinel node procedure in [facility].The final pathology revealed DCIS sentinel nodes were negative for metastatic disease. She has now gone on to receive radiation therapy by Dr. Antony Blackbird she completed this on7/12/13. After completion of radiation patient was placed on tamoxifen 20 mg on a daily basis by Dr. Ivor Reining. Thus far she is tolerating it well except for some weight gain and fatigue.   she is now seen in medical oncology for establishment of ongoing oncologic care.   Past Medical History: Past Medical History  Diagnosis Date  . GERD (gastroesophageal reflux disease)   . Cancer     benign neoplasm of large intestine  . Sleep apnea     Past Surgical History: Past Surgical History  Procedure Date  . Colonoscopy   . Laparoscopic gastric banding   . Mastectomy partial / lumpectomy     Family History: Family History  Problem Relation Age of Onset  . Coronary artery  disease    . Diabetes      Social History History  Substance Use Topics  . Smoking status: Former Smoker -- 0.2 packs/day    Types: Cigarettes    Quit date: 05/14/1981  . Smokeless tobacco: Never Used  . Alcohol Use: No    Allergies: Allergies  Allergen Reactions  . Codeine Rash    Current Medications: Current Outpatient Prescriptions  Medication Sig Dispense Refill  . dexlansoprazole (DEXILANT) 60 MG capsule Take 60 mg by mouth daily.      . ondansetron (ZOFRAN) 8 MG tablet Take 1 tablet (8 mg total) by mouth every 8 (eight) hours as needed for nausea.  15 tablet  3  . tamoxifen (NOLVADEX) 10 MG tablet Take 10 mg by mouth 2 (two) times daily.      Marland Kitchen emollient (BIAFINE) cream Apply topically as needed.        OB/GYN History:Menarche at age 63 she underwent menopause at age 17 her first child was born at 26 she did not breast-feed her.  Fertility Discussion:Patient has completed all of her family. Prior History of Cancer:No prior history o   ECOG PERFORMANCE STATUS: 0 - Asymptomatic  Genetic Counseling/testing:  Cancers.No family history of ovarian breast:  testing is not recommended  REVIEW OF SYSTEMS:  Constitutional: positive for fatigue, malaise and night sweats Patient denies any headaches double vision blurring of vision no fevers chills no myalgias or arthralgias no dominant no pain no hematuria hematochezia no vaginal bleeding or discharge. Remainder of the 14 point review of systems is negative.  PHYSICAL EXAMINATION:  Blood pressure 130/64, pulse 62, temperature 98.8 F (37.1 C), temperature source Oral, resp. rate 20, height 5' 8.5" (1.74 m), weight 278 lb 3.2 oz (126.191 kg). HEENT exam EOMI PERRLA sclerae anicteric no conjunctival pallor oral mucosa is moist neck is supple lungs are clear to auscultation and percussion cardiovascular is regular rate rhythm no murmurs abdomen is soft nontender nondistended bowel sounds are present no HSM extremities no edema  neuro patient's alert oriented otherwise nonfocal breast examination right breast well healed incisional scar  No masses nipple discharge left breast no masses or nipple discharge.   STUDIES/RESULTS: No results found.   LABS:    Chemistry   No results found for this basename: NA, K, CL, CO2, BUN, CREATININE, GLU   No results found for this basename: CALCIUM, ALKPHOS, AST, ALT, BILITOT      Lab Results  Component Value Date   WBC 5.6 09/11/2011   HGB 12.4 09/11/2011   HCT 37.8 09/11/2011   MCV 96.5 09/11/2011   PLT 178 09/11/2011       PATHOLOGY:High-grade intraductal carcinoma in situ with comedonecrosis  ASSESSMENT    56 year old female with high-grade ductal carcinoma in situ she of the right breast she is now status post lumpectomy with sentinel node biopsy in March 2013. She is gone on to receive radiation therapy completed in July 2013. She subsequently was begun on tamoxifen 20 mg daily adjuvantly. Overall she seems to be tolerating it well. A total of 5 years of therapy is planned.  PLAN:    #1 patient will continue tamoxifen 20 mg daily. If she should have any problems with this we certainly could split her dose to 10 mg in the morning 10 mg at night. Risks and benefits and rationale for treatment were discussed with the patient today.  #2 patient is recommended to continue her annual mammograms as well. We also discussed exercise eating healthy and maintaining a good healthy weight.  #3 patient will be seen back in 6 months time for followup.      Thank you so much for allowing me to participate in the care of Toni Randolph. I will continue to follow up the patient with you and assist in her care.  All questions were answered. The patient knows to call the clinic with any problems, questions or concerns. We can certainly see the patient much sooner if necessary.  I spent 55 minutes counseling the patient face to face. The total time spent in the appointment was 60  minutes.   Drue Second, MD Medical/Oncology [facility] [phone] (beeper) [phone] (Office)  09/11/2011, 4:26 PM

## 2011-09-14 ENCOUNTER — Telehealth: Payer: Self-pay | Admitting: Oncology

## 2011-09-14 ENCOUNTER — Encounter: Payer: Self-pay | Admitting: *Deleted

## 2011-09-14 NOTE — Telephone Encounter (Signed)
lmonvm adviisng the pt of her nov appts with dr Welton Flakes

## 2011-09-14 NOTE — Progress Notes (Signed)
Mailed after appt letter to pt. 

## 2011-10-06 ENCOUNTER — Telehealth: Payer: Self-pay | Admitting: *Deleted

## 2011-10-06 NOTE — Telephone Encounter (Signed)
Ok to give zofran

## 2011-10-06 NOTE — Telephone Encounter (Signed)
Patient called requesting a prescription for zofran 4mg  tabs, qty of fifteen tabs per month.  Tamoxifen continues to make her nauseated despite taking the 10 mg tabs bid.  Reports Dr. Darl Pikes in [facility] started her zofran and currently no refills.  Uses [facility] [place].  Will notify providers.

## 2011-10-10 ENCOUNTER — Other Ambulatory Visit: Payer: Self-pay | Admitting: Medical Oncology

## 2011-10-10 MED ORDER — ONDANSETRON HCL 8 MG PO TABS: 4.0000 mg | ORAL_TABLET | Freq: Three times a day (TID) | ORAL | Status: DC | PRN

## 2011-12-05 ENCOUNTER — Other Ambulatory Visit: Admitting: Lab

## 2011-12-05 ENCOUNTER — Ambulatory Visit: Admitting: Oncology

## 2011-12-22 ENCOUNTER — Encounter: Payer: Self-pay | Admitting: Oncology

## 2011-12-22 ENCOUNTER — Telehealth: Payer: Self-pay | Admitting: *Deleted

## 2011-12-22 ENCOUNTER — Other Ambulatory Visit (HOSPITAL_BASED_OUTPATIENT_CLINIC_OR_DEPARTMENT_OTHER): Admitting: Lab

## 2011-12-22 ENCOUNTER — Ambulatory Visit (HOSPITAL_BASED_OUTPATIENT_CLINIC_OR_DEPARTMENT_OTHER): Admitting: Oncology

## 2011-12-22 VITALS — BP 137/82 | HR 81 | Temp 98.6°F | Resp 20 | Ht 68.5 in | Wt 278.0 lb

## 2011-12-22 DIAGNOSIS — C50419 Malignant neoplasm of upper-outer quadrant of unspecified female breast: Secondary | ICD-10-CM

## 2011-12-22 DIAGNOSIS — D0590 Unspecified type of carcinoma in situ of unspecified breast: Secondary | ICD-10-CM

## 2011-12-22 LAB — COMPREHENSIVE METABOLIC PANEL (CC13)
Albumin: 3.5 g/dL (ref 3.5–5.0)
Alkaline Phosphatase: 66 U/L (ref 40–150)
BUN: 20 mg/dL (ref 7.0–26.0)
CO2: 25 mEq/L (ref 22–29)
Calcium: 9.6 mg/dL (ref 8.4–10.4)
Chloride: 106 mEq/L (ref 98–107)
Glucose: 113 mg/dl — ABNORMAL HIGH (ref 70–99)
Potassium: 4.3 mEq/L (ref 3.5–5.1)

## 2011-12-22 LAB — CBC WITH DIFFERENTIAL/PLATELET
Basophils Absolute: 0 10*3/uL (ref 0.0–0.1)
Eosinophils Absolute: 0.1 10*3/uL (ref 0.0–0.5)
HGB: 12.5 g/dL (ref 11.6–15.9)
MCV: 100.6 fL (ref 79.5–101.0)
MONO%: 6.2 % (ref 0.0–14.0)
NEUT#: 3.1 10*3/uL (ref 1.5–6.5)
RDW: 14.2 % (ref 11.2–14.5)

## 2011-12-22 LAB — VITAMIN D 25 HYDROXY (VIT D DEFICIENCY, FRACTURES): Vit D, 25-Hydroxy: 29 ng/mL — ABNORMAL LOW (ref 30–89)

## 2011-12-22 MED ORDER — TAMOXIFEN CITRATE 20 MG PO TABS: 20.0000 mg | ORAL_TABLET | Freq: Every day | ORAL | Status: DC

## 2011-12-22 MED ORDER — ONDANSETRON HCL 8 MG PO TABS: 8.0000 mg | ORAL_TABLET | Freq: Three times a day (TID) | ORAL | Status: DC | PRN

## 2011-12-22 NOTE — Telephone Encounter (Signed)
Gave patient appointment for 06-21-2012 starting at 9:00am

## 2011-12-22 NOTE — Patient Instructions (Addendum)
Continue tamoxifen 20 mg daily  Zofran as needed  I will see you back in 6 months

## 2011-12-22 NOTE — Progress Notes (Signed)
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  #2 she then went on to receive radiation therapy by Dr. Antony Blackbird   #3 patient is now on tamoxifen 20 mg daily overall she is tolerating it well. 5 years of therapy is planned.  CURRENT THERAPY: Tamoxifen 20 mg daily  INTERVAL HISTORY: Toni Randolph 56 y.o. female returns for followup visit. Overall she's doing well she seems to be tolerating tamoxifen quite nicely. She does develop some nausea for which she does take Zofran and that is helping her significantly. Patient is incising and eating a healthy diet. She is also now planning on having breast reconstruction performed in [place] where her husband is currently deployed. She denies any headaches double vision blurring of vision no fevers chills night sweats. No vomiting. No myalgias and arthralgias. No vaginal bleeding or discharge. Remainder of the 10 point review of systems is negative.  MEDICAL HISTORY: Past Medical History  Diagnosis Date  . GERD (gastroesophageal reflux disease)   . Cancer     benign neoplasm of large intestine  . Sleep apnea     ALLERGIES:  is allergic to codeine.  MEDICATIONS:  Current Outpatient Prescriptions  Medication Sig Dispense Refill  . dexlansoprazole (DEXILANT) 60 MG capsule Take 60 mg by mouth daily.      . ondansetron (ZOFRAN) 8 MG tablet Take 0.5 tablets (4 mg total) by mouth every 8 (eight) hours as needed for nausea.  15 tablet  3  . tamoxifen (NOLVADEX) 10 MG tablet Take 10 mg by mouth 2 (two) times daily.      Marland Kitchen emollient (BIAFINE) cream Apply topically as needed.        SURGICAL HISTORY:  Past Surgical History  Procedure Date  . Colonoscopy   . Laparoscopic gastric banding   . Mastectomy partial / lumpectomy      REVIEW OF SYSTEMS:  Pertinent items are noted in HPI.   HEALTH MAINTENANCE:  PHYSICAL EXAMINATION: Blood pressure 137/82, pulse 81, temperature 98.6 F (37 C), temperature source Oral, resp. rate 20, height 5' 8.5" (1.74 m), weight 278 lb (126.1 kg). Body mass index is 41.65 kg/(m^2). ECOG PERFORMANCE STATUS: 0 - Asymptomatic   General appearance: alert, cooperative and appears stated age Lymph nodes: Cervical, supraclavicular, and axillary nodes normal. Resp: clear to auscultation bilaterally Back: symmetric, no curvature. ROM normal. No CVA tenderness. Cardio: regular rate and rhythm GI: soft, non-tender; bowel sounds normal; no masses,  no organomegaly Extremities: extremities normal, atraumatic, no cyanosis or edema Neurologic: Grossly normal Left breast no masses nipple discharge no skin changes. Right breast reveals well-healed surgical scar is dropping of the skin secondary to radiation therapy otherwise no palpable masses no nipple discharge no inversion or retraction of the nipple.  LABORATORY DATA: Lab Results  Component Value Date   WBC 4.6 12/22/2011   HGB 12.5 12/22/2011   HCT 37.1 12/22/2011   MCV 100.6 12/22/2011   PLT 185 12/22/2011      Chemistry      Component Value Date/Time   NA 139 09/11/2011 1539   K 4.0 09/11/2011 1539   CL 105 09/11/2011 1539   CO2 26 09/11/2011 1539   BUN 21.0 09/11/2011 1539   CREATININE 0.9 09/11/2011 1539      Component Value  Date/Time   CALCIUM 9.9 09/11/2011 1539   ALKPHOS 72 09/11/2011 1539   AST 17 09/11/2011 1539   ALT 15 09/11/2011 1539   BILITOT 0.30 09/11/2011 1539       RADIOGRAPHIC STUDIES:  No results found.  ASSESSMENT: 56 year old female with  #1 DCIS of the right breast status post lumpectomy followed by radiation therapy then tamoxifen 20 mg daily. Overall she's tolerating it well without any significant problems. She will continue this for 5 years.  #2 patient is modifying her lifestyle she is eating healthy  her exercising to lose weight. I have encouraged her to do so. We did not talk extensively about diet and exercise again.   PLAN:   #1 patient will continue tamoxifen 20 mg daily.  #2 I will see her back in 6 months time.   All questions were answered. The patient knows to call the clinic with any problems, questions or concerns. We can certainly see the patient much sooner if necessary.  I spent 25 minutes counseling the patient face to face. The total time spent in the appointment was 30 minutes.    Drue Second, MD Medical/Oncology [facility] [phone] (beeper) [phone] (Office)  12/22/2011, 10:01 AM

## 2012-01-01 ENCOUNTER — Telehealth: Payer: Self-pay | Admitting: *Deleted

## 2012-01-01 NOTE — Telephone Encounter (Signed)
Per MD notified pt to pt to take Vitamin d3 5000 units daily

## 2012-01-01 NOTE — Telephone Encounter (Signed)
Message copied by Cooper Render on Mon Jan 01, 2012 12:17 PM ------      Message from: Toni Randolph      Created: Fri Dec 22, 2011  5:01 PM       Call patient: needs vitamin D3 5000 iu daily

## 2012-02-19 ENCOUNTER — Encounter: Payer: Self-pay | Admitting: *Deleted

## 2012-02-19 ENCOUNTER — Ambulatory Visit
Admission: RE | Admit: 2012-02-19 | Discharge: 2012-02-19 | Disposition: A | Discharge status: Home / Self Care | Source: Ambulatory Visit | Attending: Radiation Oncology | Admitting: Radiation Oncology

## 2012-02-19 ENCOUNTER — Encounter: Payer: Self-pay | Admitting: Radiation Oncology

## 2012-02-19 VITALS — BP 128/79 | HR 71 | Temp 99.2°F | Resp 20 | Wt 271.9 lb

## 2012-02-19 DIAGNOSIS — C50419 Malignant neoplasm of upper-outer quadrant of unspecified female breast: Secondary | ICD-10-CM

## 2012-02-19 DIAGNOSIS — C50919 Malignant neoplasm of unspecified site of unspecified female breast: Secondary | ICD-10-CM | POA: Insufficient documentation

## 2012-02-19 DIAGNOSIS — Z79899 Other long term (current) drug therapy: Secondary | ICD-10-CM | POA: Insufficient documentation

## 2012-02-19 MED ORDER — BIAFINE EX EMUL
CUTANEOUS | Status: DC | PRN
  Administered 2012-02-19: 1 via TOPICAL

## 2012-02-19 NOTE — Progress Notes (Signed)
  Radiation Oncology         [phone] ________________________________  Name: Toni Randolph MRN: [id number]  Date: 02/19/2012  DOB: [birth year]  Follow-Up Visit Note  CC: [name]., MD  [name]., MD  Diagnosis:   High-grade intraductal carcinoma the right breast  Interval Since Last Radiation:  7 months  Narrative:  The patient returns today for routine follow-up.  She seems to be doing well this time. She recently had cosmetic surgery involving both breasts and is pleased with her results. she denies any pain in either breast at this time.  She is on tamoxifen and seems to be tolerating this well.  She did undergo assessment today as part of the protocol concerning the skin changes ([id number] [id number]).  This was somewhat difficult in light of her surgery as above.                            ALLERGIES:  is allergic to codeine.  Meds: Current Outpatient Prescriptions  Medication Sig Dispense Refill  . dexlansoprazole (DEXILANT) 60 MG capsule Take 60 mg by mouth daily.      Marland Kitchen emollient (BIAFINE) cream Apply topically as needed.      . ondansetron (ZOFRAN) 8 MG tablet Take 1 tablet (8 mg total) by mouth every 8 (eight) hours as needed for nausea.  15 tablet  12  . tamoxifen (NOLVADEX) 20 MG tablet Take 1 tablet (20 mg total) by mouth daily.  90 tablet  12   Current Facility-Administered Medications  Medication Dose Route Frequency Provider Last Rate Last Dose  . topical emolient (BIAFINE) emulsion   Topical PRN Billie Lade, MD      . topical emolient (BIAFINE) emulsion   Topical PRN Billie Lade, MD        Physical Findings: The patient is in no acute distress. Patient is alert and oriented.  weight is 271 lb 14.4 oz (123.333 kg). Her oral temperature is 99.2 F (37.3 C). Her blood pressure is 128/79 and her pulse is 71. Her respiration is 20. Marland Kitchen  No palpable supraclavicular or axillary adenopathy. The lungs are clear to auscultation. The heart has a regular  rhythm and rate. The left breast shows reconstruction without palpable or visible signs recurrence.  The right breast shows reconstruction without palpable or visible signs recurrence. Patient does have some mild hyperpigmentation changes throughout much of the right breast.  Lab Findings: Lab Results  Component Value Date   WBC 4.6 12/22/2011   HGB 12.5 12/22/2011   HCT 37.1 12/22/2011   MCV 100.6 12/22/2011   PLT 185 12/22/2011     Impression:  No evidence of recurrence on clinical exam  Plan:  Patient returns for routine followup in 6 months and will have skin reaction assessment as part of the above protocol.  _____________________________________  -----------------------------------  Billie Lade, PhD, MD

## 2012-02-19 NOTE — Progress Notes (Signed)
Patient here f/u right breast rad txs:5/20/7/12/13, had reconstruction  B/l surgery on breasts, Dr. Clydie Braun, [place] dec. 19/2013, looks great, no c/o pain, Biopsy on both breast , will send copy, stated nothing suspicious, perr patient  5:04 PM

## 2012-02-19 NOTE — Progress Notes (Signed)
02/19/12 at 4:52pm - [facility] [id number] month 6 visit- The pt was into the cancer center this afternoon for her 6 months post RT visit.  She completed her month 6 questionnaires.  She also confirmed that she is still taking her tamoxifen daily.  She denies using any new skin products on her affected breast.  The pt states she had reconstruction surgery 4 weeks ago in [place].  She stated that she also had her lumpectomy in [place].  The pt states she is thrilled with the results.  The pt's photos were taken today.  Isidoro Donning, study coordinator, has notified the study that the pt has undergone breast reconstruction.  The pt will be seen by Dr. Roselind Messier today.  Dr. Roselind Messier said that he will complete the RTOG SOMA form.  The pt was thanked for her continued support of the study. The pt is aware that her last research appointment will be the 12 months post radiation completion visit.  02/20/12 at 9:03am- Dr. Roselind Messier completed the RTOG SOMA form during his visit with the pt on 02/19/12.  The MD stated he was unsure how to complete the form since the pt just had breast reconstruction surgery.  A narrative note regarding the pt's breast reconstruction will accompany the data forms to [facility] data management.  The photos will be uploaded today.

## 2012-02-19 NOTE — Addendum Note (Signed)
Encounter addended by: Delynn Flavin, RN on: 02/19/2012  7:43 PM<BR>     Documentation filed: Charges VN, Inpatient MAR

## 2012-02-29 ENCOUNTER — Ambulatory Visit: Admitting: Radiation Oncology

## 2012-03-07 ENCOUNTER — Ambulatory Visit: Admitting: Radiation Oncology

## 2012-06-21 ENCOUNTER — Telehealth: Payer: Self-pay | Admitting: Oncology

## 2012-06-21 ENCOUNTER — Other Ambulatory Visit (HOSPITAL_BASED_OUTPATIENT_CLINIC_OR_DEPARTMENT_OTHER): Admitting: Lab

## 2012-06-21 ENCOUNTER — Encounter: Payer: Self-pay | Admitting: Oncology

## 2012-06-21 ENCOUNTER — Ambulatory Visit (HOSPITAL_BASED_OUTPATIENT_CLINIC_OR_DEPARTMENT_OTHER): Admitting: Oncology

## 2012-06-21 VITALS — BP 143/91 | HR 65 | Temp 98.5°F | Resp 20 | Ht 68.5 in | Wt 274.3 lb

## 2012-06-21 DIAGNOSIS — Z170 Estrogen receptor positive status [ER+]: Secondary | ICD-10-CM

## 2012-06-21 DIAGNOSIS — D0590 Unspecified type of carcinoma in situ of unspecified breast: Secondary | ICD-10-CM

## 2012-06-21 DIAGNOSIS — C50419 Malignant neoplasm of upper-outer quadrant of unspecified female breast: Secondary | ICD-10-CM

## 2012-06-21 DIAGNOSIS — C50411 Malignant neoplasm of upper-outer quadrant of right female breast: Secondary | ICD-10-CM

## 2012-06-21 DIAGNOSIS — D0591 Unspecified type of carcinoma in situ of right breast: Secondary | ICD-10-CM

## 2012-06-21 LAB — COMPREHENSIVE METABOLIC PANEL (CC13)
Albumin: 3.3 g/dL — ABNORMAL LOW (ref 3.5–5.0)
Alkaline Phosphatase: 58 U/L (ref 40–150)
BUN: 15.9 mg/dL (ref 7.0–26.0)
CO2: 25 mEq/L (ref 22–29)
Glucose: 99 mg/dl (ref 70–99)
Potassium: 4.1 mEq/L (ref 3.5–5.1)
Sodium: 140 mEq/L (ref 136–145)
Total Bilirubin: 0.42 mg/dL (ref 0.20–1.20)
Total Protein: 6.4 g/dL (ref 6.4–8.3)

## 2012-06-21 LAB — CBC WITH DIFFERENTIAL/PLATELET
Eosinophils Absolute: 0.1 10*3/uL (ref 0.0–0.5)
MONO#: 0.5 10*3/uL (ref 0.1–0.9)
NEUT#: 3.4 10*3/uL (ref 1.5–6.5)
RBC: 3.42 10*6/uL — ABNORMAL LOW (ref 3.70–5.45)
RDW: 14.7 % — ABNORMAL HIGH (ref 11.2–14.5)
WBC: 5.2 10*3/uL (ref 3.9–10.3)
lymph#: 1.2 10*3/uL (ref 0.9–3.3)

## 2012-06-21 MED ORDER — TAMOXIFEN CITRATE 20 MG PO TABS: 20.0000 mg | ORAL_TABLET | Freq: Every day | ORAL | Status: DC

## 2012-06-21 MED ORDER — ONDANSETRON HCL 8 MG PO TABS: 8.0000 mg | ORAL_TABLET | Freq: Three times a day (TID) | ORAL | Status: DC | PRN

## 2012-06-21 NOTE — Patient Instructions (Addendum)
#  1 you're tolerating tamoxifen very well.  #2 your blood work looks Financial planner.  #3 continue tamoxifen 20 mg daily for a total of 5 years.  #4 I will see you back in 6 months time for followup.

## 2012-06-27 ENCOUNTER — Encounter (INDEPENDENT_AMBULATORY_CARE_PROVIDER_SITE_OTHER): Payer: Self-pay | Admitting: General Surgery

## 2012-07-14 NOTE — Progress Notes (Signed)
OFFICE PROGRESS NOTE  CC  Toni Randolph., MD [address] [place] [address]  DIAGNOSIS: 57 year old female with DCIS of the right breast  PRIOR THERAPY:  #1 patient is status post right breast lumpectomy for a DCIS that was ER positive.  #2 she then went on to receive radiation therapy by Dr. Antony Blackbird   #3 patient is now on tamoxifen 20 mg daily overall she is tolerating it well. 5 years of therapy is planned.  CURRENT THERAPY: Tamoxifen 20 mg daily  INTERVAL HISTORY: Toni Randolph 57 y.o. female returns for followup visit. Overall she's doing well she seems to be tolerating tamoxifen but does develop some nausea for which she does take Zofran and that is helping her significantly. Patient is excersing and eating a healthy diet. She is also now planning on having breast reconstruction performed in [place] where her husband is currently deployed. She denies any headaches double vision blurring of vision no fevers chills night sweats. No vomiting. No myalgias and arthralgias. No vaginal bleeding or discharge. Remainder of the 10 point review of systems is negative.  MEDICAL HISTORY: Past Medical History  Diagnosis Date  . GERD (gastroesophageal reflux disease)   . Cancer     benign neoplasm of large intestine  . Sleep apnea     ALLERGIES:  is allergic to codeine.  MEDICATIONS:  Current Outpatient Prescriptions  Medication Sig Dispense Refill  . dexlansoprazole (DEXILANT) 60 MG capsule Take 60 mg by mouth daily.      Marland Kitchen emollient (BIAFINE) cream Apply topically as needed.      . ondansetron (ZOFRAN) 8 MG tablet Take 1 tablet (8 mg total) by mouth every 8 (eight) hours as needed for nausea.  30 tablet  12  . tamoxifen (NOLVADEX) 20 MG tablet Take 1 tablet (20 mg total) by mouth daily.  30 tablet  12   No current facility-administered medications for this visit.    SURGICAL HISTORY:  Past Surgical History  Procedure Laterality Date  .  Colonoscopy    . Laparoscopic gastric banding    . Mastectomy partial / lumpectomy      REVIEW OF SYSTEMS:  Pertinent items are noted in HPI.   HEALTH MAINTENANCE:  PHYSICAL EXAMINATION: Blood pressure 143/91, pulse 65, temperature 98.5 F (36.9 C), temperature source Oral, resp. rate 20, height 5' 8.5" (1.74 m), weight 274 lb 4.8 oz (124.422 kg). Body mass index is 41.1 kg/(m^2). ECOG PERFORMANCE STATUS: 0 - Asymptomatic   General appearance: alert, cooperative and appears stated age Lymph nodes: Cervical, supraclavicular, and axillary nodes normal. Resp: clear to auscultation bilaterally Back: symmetric, no curvature. ROM normal. No CVA tenderness. Cardio: regular rate and rhythm GI: soft, non-tender; bowel sounds normal; no masses,  no organomegaly Extremities: extremities normal, atraumatic, no cyanosis or edema Neurologic: Grossly normal Left breast no masses nipple discharge no skin changes. Right breast reveals well-healed surgical scar is dropping of the skin secondary to radiation therapy otherwise no palpable masses no nipple discharge no inversion or retraction of the nipple.  LABORATORY DATA: Lab Results  Component Value Date   WBC 5.2 06/21/2012   HGB 11.4* 06/21/2012   HCT 33.4* 06/21/2012   MCV 97.8 06/21/2012   PLT 206 06/21/2012      Chemistry      Component Value Date/Time   NA 140 06/21/2012 0912   K 4.1 06/21/2012 0912   CL 107 06/21/2012 0912   CO2 25 06/21/2012 0912   BUN 15.9 06/21/2012  0912   CREATININE 0.9 06/21/2012 0912      Component Value Date/Time   CALCIUM 9.3 06/21/2012 0912   ALKPHOS 58 06/21/2012 0912   AST 15 06/21/2012 0912   ALT 12 06/21/2012 0912   BILITOT 0.42 06/21/2012 0912       RADIOGRAPHIC STUDIES:  No results found.  ASSESSMENT: 57 year old female with  #1 DCIS of the right breast status post lumpectomy followed by radiation therapy then tamoxifen 20 mg daily. Overall she's tolerating it well without any significant problems. She will continue  this for 5 years.  #2 patient is modifying her lifestyle she is eating healthy her exercising to lose weight. I have encouraged her to do so. We did not talk extensively about diet and exercise again.   PLAN:   #1 patient will continue tamoxifen 20 mg daily.  #2 I will see her back in 6 months time.   All questions were answered. The patient knows to call the clinic with any problems, questions or concerns. We can certainly see the patient much sooner if necessary.  I spent 25 minutes counseling the patient face to face. The total time spent in the appointment was 30 minutes.    Drue Second, MD Medical/Oncology [facility] [phone] (beeper) [phone] (Office)

## 2012-08-19 ENCOUNTER — Ambulatory Visit: Payer: Self-pay | Admitting: Radiation Oncology

## 2012-08-20 ENCOUNTER — Telehealth: Payer: Self-pay | Admitting: Radiation Oncology

## 2012-08-20 NOTE — Telephone Encounter (Signed)
Faxed EOT 08/13/11 to [facility].  Received confirmation.  OK per SW in JK's absence.

## 2012-08-22 ENCOUNTER — Encounter: Payer: Self-pay | Admitting: Radiation Oncology

## 2012-08-22 DIAGNOSIS — C50919 Malignant neoplasm of unspecified site of unspecified female breast: Secondary | ICD-10-CM | POA: Insufficient documentation

## 2012-08-22 DIAGNOSIS — Z923 Personal history of irradiation: Secondary | ICD-10-CM | POA: Insufficient documentation

## 2012-08-26 ENCOUNTER — Ambulatory Visit: Admitting: Radiation Oncology

## 2012-08-26 ENCOUNTER — Encounter: Payer: Self-pay | Admitting: *Deleted

## 2012-08-26 NOTE — Progress Notes (Unsigned)
08/26/12 @ 8:50 am, [id number], Twelve Month Visit:  Toni Randolph was scheduled for her 35-Month Visit today.  This appointment has been changed at least once.  Confirmed with her on 08/23/12 that she received the mailed questionnaires that she would keep the 08/26/12 appointment.  She called today to say that she has to work and cannot come today due to her office being understaffed due to a maternity leave.  She canceled the appointment with the Radiation Oncologist, Dr. Roselind Messier, for today.  I spoke with her by phone and she said she is unable to come in regardless of protocol requirements.  Tomorrow, 08/27/12 is the outside date for her to be seen within window for the study.  She cannot come in until 09/19/12 due to her and the physician's schedules.  Will see her at that time for a late 35-Month Visit.  Will contact [facility] to let them know her visit will be out of window.  09/19/12 @ 16:20:  Toni Randolph did come into the [facility] for an assessment by the radiation oncologist.  She completed her questionnaires as requested.  Photographs were taken per protocol and uploaded to the [facility] data base.  This will complete her participation in the trial.  She has no evidence of recurrence, no new treatment, no new skin lotions, and does continue on tamoxifen.

## 2012-09-19 ENCOUNTER — Encounter: Payer: Self-pay | Admitting: Radiation Oncology

## 2012-09-19 ENCOUNTER — Ambulatory Visit
Admission: RE | Admit: 2012-09-19 | Discharge: 2012-09-19 | Disposition: A | Discharge status: Home / Self Care | Source: Ambulatory Visit | Attending: Radiation Oncology | Admitting: Radiation Oncology

## 2012-09-19 VITALS — BP 130/61 | HR 68 | Temp 98.5°F | Resp 20 | Wt 274.1 lb

## 2012-09-19 DIAGNOSIS — C50411 Malignant neoplasm of upper-outer quadrant of right female breast: Secondary | ICD-10-CM

## 2012-09-19 NOTE — Progress Notes (Signed)
  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  She is doing well and without complaints. She specifically denies any consistent pain in either breast,  nipple discharge or bleeding. She is on tamoxifen and seems to be tolerating this medication well. Patient did undergo skin assessment as part of protocol [id number].                              ALLERGIES:  is allergic to codeine.  Meds: Current Outpatient Prescriptions  Medication Sig Dispense Refill  . dexlansoprazole (DEXILANT) 60 MG capsule Take 60 mg by mouth daily.      . ondansetron (ZOFRAN) 8 MG tablet Take 1 tablet (8 mg total) by mouth every 8 (eight) hours as needed for nausea.  30 tablet  12  . tamoxifen (NOLVADEX) 20 MG tablet Take 1 tablet (20 mg total) by mouth daily.  30 tablet  12  . emollient (BIAFINE) cream Apply topically as needed.       No current facility-administered medications for this encounter.    Physical Findings: The patient is in no acute distress. Patient is alert and oriented.  weight is 274 lb 1.6 oz (124.331 kg). Her oral temperature is 98.5 F (36.9 C). Her blood pressure is 130/61 and her pulse is 68. Her respiration is 20. Marland Kitchen No palpable supraclavicular or axillary adenopathy. The lungs are clear to auscultation. The heart has a regular rhythm and rate. Examination of the left breast reveals scars from prior  reduction mammoplasty. There is no dominant mass appreciated breast nipple discharge or bleeding.   examination of the right breast area reveals some mild hyperpigmentation changes. There is no dominant mass appreciated breast nipple  discharge or bleeding. There is some induration along her reconstruction scars in the lower outer aspect of the right breast but no dominant masses appreciated.  Lab Findings: Lab Results  Component Value Date   WBC 5.2 06/21/2012   HGB 11.4* 06/21/2012   HCT 33.4* 06/21/2012   MCV 97.8 06/21/2012   PLT 206 06/21/2012      Radiographic Findings: No results found.  Impression:  No evidence of recurrence on clinical exam today.  Plan:  Routine followup in 6 months as part of above protocol.  _____________________________________  -----------------------------------  Billie Lade, PhD, MD

## 2012-09-19 NOTE — Progress Notes (Signed)
Follow up, s/p rad tx right breast 06/05/11-07/28/11 5040Gy/47fx No c/opain or discomfrot, [place], takes tamoxifen 20mg  daily no hot flashes, no c/o pain in breast, last seen Dr. Welton Flakes 07/14/12 next appt 12/20/12 4:42 PM

## 2012-12-19 ENCOUNTER — Encounter: Payer: Self-pay | Admitting: Oncology

## 2012-12-19 ENCOUNTER — Other Ambulatory Visit: Payer: Self-pay | Admitting: Radiation Oncology

## 2012-12-19 ENCOUNTER — Other Ambulatory Visit: Payer: Self-pay | Admitting: Emergency Medicine

## 2012-12-19 DIAGNOSIS — C50919 Malignant neoplasm of unspecified site of unspecified female breast: Secondary | ICD-10-CM

## 2012-12-19 DIAGNOSIS — C50419 Malignant neoplasm of upper-outer quadrant of unspecified female breast: Secondary | ICD-10-CM

## 2012-12-19 NOTE — Progress Notes (Signed)
Patient left a message again. I called and left mess again for her that PCP must do referral. I spoke with Toni Randolph

## 2012-12-19 NOTE — Progress Notes (Signed)
Called and patient said she thought referral should be on file. Toni Randolph and Toni Randolph looked and there is none. I called her back and left on vmail her pcp must fax a new one today for appt for tomorrow.

## 2012-12-19 NOTE — Progress Notes (Signed)
I also advised Sharyl Nimrod I advised the patient must come from PCP only for visit.

## 2012-12-19 NOTE — Progress Notes (Signed)
Called the patient and left a message to call me back.  Per Loura Pardon call on [employer] they need to have their primary Dr. refer them I cant get one. The patient must get the referral.

## 2012-12-20 ENCOUNTER — Telehealth: Payer: Self-pay | Admitting: Oncology

## 2012-12-20 ENCOUNTER — Ambulatory Visit: Admitting: Oncology

## 2012-12-20 ENCOUNTER — Other Ambulatory Visit: Admitting: Lab

## 2012-12-20 NOTE — Telephone Encounter (Signed)
, °

## 2012-12-23 ENCOUNTER — Encounter: Payer: Self-pay | Admitting: Radiation Oncology

## 2012-12-23 NOTE — Progress Notes (Signed)
12/23/12:  Received [employer] authorization - ref #[id number].  Service codes [phone] valid for 1 visit 08/21/12-11/18/12; [phone] valid for 5 visits 08/21/12-08/21/13

## 2013-01-20 ENCOUNTER — Encounter: Payer: Self-pay | Admitting: Oncology

## 2013-01-20 NOTE — Progress Notes (Signed)
Dr. Sondra Come called to advise the patient thinks she needs referral from him. He doesn't do---and he advised that the patient had already seen Dr. Humphrey Rolls before so not sure why she called him??? See previous notes. She has [employer] and she has to get ok for her visits with [place] per Benjamine Mola.

## 2013-01-21 ENCOUNTER — Telehealth: Payer: Self-pay | Admitting: *Deleted

## 2013-01-21 ENCOUNTER — Telehealth: Payer: Self-pay | Admitting: Oncology

## 2013-01-21 ENCOUNTER — Other Ambulatory Visit: Payer: Self-pay | Admitting: Radiation Oncology

## 2013-01-21 ENCOUNTER — Other Ambulatory Visit: Payer: Self-pay | Admitting: Emergency Medicine

## 2013-01-21 DIAGNOSIS — C50419 Malignant neoplasm of upper-outer quadrant of unspecified female breast: Secondary | ICD-10-CM

## 2013-01-21 NOTE — Telephone Encounter (Signed)
Called patient to update her regarding her fu visit with Dr. Humphrey Rolls, spoke with Ailene Ravel, Dr. Laurelyn Sickle nurse and she told me that she would call this patient , informed patient that this would happen.

## 2013-02-25 ENCOUNTER — Telehealth: Payer: Self-pay | Admitting: Oncology

## 2013-02-25 NOTE — Telephone Encounter (Signed)
, °

## 2013-02-28 ENCOUNTER — Ambulatory Visit: Admitting: Oncology

## 2013-02-28 ENCOUNTER — Other Ambulatory Visit

## 2013-03-17 ENCOUNTER — Ambulatory Visit (HOSPITAL_BASED_OUTPATIENT_CLINIC_OR_DEPARTMENT_OTHER): Admitting: Oncology

## 2013-03-17 ENCOUNTER — Ambulatory Visit (HOSPITAL_BASED_OUTPATIENT_CLINIC_OR_DEPARTMENT_OTHER)

## 2013-03-17 ENCOUNTER — Encounter: Payer: Self-pay | Admitting: Oncology

## 2013-03-17 ENCOUNTER — Other Ambulatory Visit (HOSPITAL_BASED_OUTPATIENT_CLINIC_OR_DEPARTMENT_OTHER)

## 2013-03-17 ENCOUNTER — Telehealth: Payer: Self-pay | Admitting: *Deleted

## 2013-03-17 VITALS — BP 168/88 | HR 73 | Temp 98.8°F | Resp 18 | Ht 68.5 in | Wt 280.4 lb

## 2013-03-17 DIAGNOSIS — C50419 Malignant neoplasm of upper-outer quadrant of unspecified female breast: Secondary | ICD-10-CM

## 2013-03-17 DIAGNOSIS — D0590 Unspecified type of carcinoma in situ of unspecified breast: Secondary | ICD-10-CM

## 2013-03-17 DIAGNOSIS — E039 Hypothyroidism, unspecified: Secondary | ICD-10-CM

## 2013-03-17 DIAGNOSIS — Z170 Estrogen receptor positive status [ER+]: Secondary | ICD-10-CM

## 2013-03-17 LAB — COMPREHENSIVE METABOLIC PANEL (CC13)
ALT: 33 U/L (ref 0–55)
AST: 27 U/L (ref 5–34)
Albumin: 3.9 g/dL (ref 3.5–5.0)
Alkaline Phosphatase: 88 U/L (ref 40–150)
Anion Gap: 7 mEq/L (ref 3–11)
BILIRUBIN TOTAL: 0.38 mg/dL (ref 0.20–1.20)
BUN: 18.7 mg/dL (ref 7.0–26.0)
CO2: 24 mEq/L (ref 22–29)
CREATININE: 0.8 mg/dL (ref 0.6–1.1)
Calcium: 10.5 mg/dL — ABNORMAL HIGH (ref 8.4–10.4)
Chloride: 111 mEq/L — ABNORMAL HIGH (ref 98–109)
Glucose: 100 mg/dl (ref 70–140)
Potassium: 3.9 mEq/L (ref 3.5–5.1)
Sodium: 142 mEq/L (ref 136–145)
Total Protein: 7.7 g/dL (ref 6.4–8.3)

## 2013-03-17 LAB — CBC WITH DIFFERENTIAL/PLATELET
BASO%: 0.2 % (ref 0.0–2.0)
Basophils Absolute: 0 10*3/uL (ref 0.0–0.1)
EOS%: 1 % (ref 0.0–7.0)
Eosinophils Absolute: 0.1 10*3/uL (ref 0.0–0.5)
HEMATOCRIT: 38.3 % (ref 34.8–46.6)
HGB: 12.4 g/dL (ref 11.6–15.9)
LYMPH#: 1.6 10*3/uL (ref 0.9–3.3)
LYMPH%: 22.4 % (ref 14.0–49.7)
MCH: 30.9 pg (ref 25.1–34.0)
MCHC: 32.3 g/dL (ref 31.5–36.0)
MCV: 95.6 fL (ref 79.5–101.0)
MONO#: 0.5 10*3/uL (ref 0.1–0.9)
MONO%: 7.4 % (ref 0.0–14.0)
NEUT#: 5 10*3/uL (ref 1.5–6.5)
NEUT%: 69 % (ref 38.4–76.8)
PLATELETS: 247 10*3/uL (ref 145–400)
RBC: 4 10*6/uL (ref 3.70–5.45)
RDW: 16.7 % — ABNORMAL HIGH (ref 11.2–14.5)
WBC: 7.3 10*3/uL (ref 3.9–10.3)

## 2013-03-17 MED ORDER — TAMOXIFEN CITRATE 20 MG PO TABS: 20.0000 mg | ORAL_TABLET | Freq: Every day | ORAL | Status: AC

## 2013-03-17 MED ORDER — ONDANSETRON HCL 8 MG PO TABS: 8.0000 mg | ORAL_TABLET | Freq: Three times a day (TID) | ORAL | Status: DC | PRN

## 2013-03-17 NOTE — Progress Notes (Signed)
OFFICE PROGRESS NOTE  CC  Toni Randolph., MD 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  #2 she then went on to receive radiation therapy by Dr. Gery Pray   #3 patient is now on tamoxifen 20 mg daily overall she is tolerating it well. 5 years of therapy is planned.  CURRENT THERAPY: Tamoxifen 20 mg daily  INTERVAL HISTORY: SHERRIL SHIPMAN 58 y.o. female returns for followup visit. Overall she's doing well she seems to be tolerating tamoxifen but does develop some nausea for which she does take Zofran and that is helping her significantly. Patient is excersing and eating a healthy diet. She is also now planning on having breast reconstruction performed in [place] where her husband is currently deployed. She denies any headaches double vision blurring of vision no fevers chills night sweats. No vomiting. No myalgias and arthralgias. No vaginal bleeding or discharge. Remainder of the 10 point review of systems is negative.  MEDICAL HISTORY: Past Medical History  Diagnosis Date  . GERD (gastroesophageal reflux disease)   . Cancer     benign neoplasm of large intestine  . Sleep apnea   . Breast cancer 04/2011    central right breast  . History of radiation therapy 06/05/11- 07/28/11    right breast 5040 cGy in 28 fx    ALLERGIES:  is allergic to codeine.  MEDICATIONS:  Current Outpatient Prescriptions  Medication Sig Dispense Refill  . dexlansoprazole (DEXILANT) 60 MG capsule Take 60 mg by mouth daily.       . ondansetron (ZOFRAN) 8 MG tablet Take 1 tablet (8 mg total) by mouth every 8 (eight) hours as needed for nausea.  30 tablet  12  . tamoxifen (NOLVADEX) 20 MG tablet Take 1 tablet (20 mg total) by mouth daily.  30 tablet  12   No current facility-administered medications for this visit.    SURGICAL HISTORY:  Past Surgical History  Procedure Laterality Date  . Colonoscopy    . Laparoscopic gastric banding    . Mastectomy partial / lumpectomy      REVIEW OF SYSTEMS:  Pertinent items are noted in HPI.   HEALTH MAINTENANCE:  PHYSICAL EXAMINATION: Blood pressure 168/88, pulse 73, temperature 98.8 F (37.1 C), temperature source Oral, resp. rate 18, height 5' 8.5" (1.74 m), weight 280 lb 6.4 oz (127.189 kg). Body mass index is 42.01 kg/(m^2). ECOG PERFORMANCE STATUS: 0 - Asymptomatic   General appearance: alert, cooperative and appears stated age Lymph nodes: Cervical, supraclavicular, and axillary nodes normal. Resp: clear to auscultation bilaterally Back: symmetric, no curvature. ROM normal. No CVA tenderness. Cardio: regular rate and rhythm GI: soft, non-tender; bowel sounds normal; no masses,  no organomegaly Extremities: extremities normal, atraumatic, no cyanosis or edema Neurologic: Grossly normal Left breast no masses nipple discharge no skin changes. Right breast reveals well-healed surgical scar is dropping of the skin secondary to radiation therapy otherwise no palpable masses no nipple discharge no inversion or retraction of the nipple.  LABORATORY DATA: Lab Results  Component Value Date   WBC 7.3 03/17/2013   HGB 12.4 03/17/2013   HCT 38.3 03/17/2013   MCV 95.6 03/17/2013   PLT 247 03/17/2013      Chemistry      Component Value Date/Time   NA 140 06/21/2012 0912   K 4.1 06/21/2012 0912   CL 107 06/21/2012 0912   CO2 25 06/21/2012 0912   BUN 15.9 06/21/2012 0912   CREATININE 0.9 06/21/2012 0912      Component Value Date/Time   CALCIUM 9.3 06/21/2012 0912   ALKPHOS 58 06/21/2012  0912   AST 15 06/21/2012 0912   ALT 12 06/21/2012 0912   BILITOT 0.42 06/21/2012 0912       RADIOGRAPHIC STUDIES:  No results found.  ASSESSMENT/PLAN: 58 year old female with  #1 Stage 0 (TisNx) DCIS of the right breast status post lumpectomy followed by radiation therapy then tamoxifen 20 mg daily. Overall she's tolerating it well without any significant problems. She will continue this for 5 years.  #2 patient is modifying her lifestyle she is eating healthy her exercising to lose weight. I have encouraged her to do so. We did not talk extensively about diet and exercise again.  #3. Follow up: patient will be seen back in 6 months.   All questions were answered. The patient knows to call the clinic with any problems, questions or concerns. We can certainly see the patient much sooner if necessary.  I spent 25 minutes counseling the patient face to face. The total time spent in the appointment was 30 minutes.    Marcy Panning, MD [facility] [phone] (beeper) [phone] (Office)

## 2013-03-17 NOTE — Patient Instructions (Signed)

## 2013-03-17 NOTE — Telephone Encounter (Signed)
appts made and printed...td 

## 2013-03-18 ENCOUNTER — Other Ambulatory Visit

## 2013-03-18 DIAGNOSIS — E039 Hypothyroidism, unspecified: Secondary | ICD-10-CM

## 2013-03-18 LAB — T4: T4 TOTAL: 7 ug/dL (ref 5.0–12.5)

## 2013-03-18 LAB — TSH CHCC: TSH: 1.369 m(IU)/L (ref 0.308–3.960)

## 2013-03-18 LAB — T3: T3, Total: 92.9 ng/dL (ref 80.0–204.0)

## 2013-03-20 ENCOUNTER — Telehealth: Payer: Self-pay | Admitting: *Deleted

## 2013-03-20 NOTE — Telephone Encounter (Signed)
Message copied by Hebert Soho on Thu Mar 20, 2013  2:49 PM ------      Message from: Deatra Robinson      Created: Wed Mar 19, 2013  5:36 PM       Thyroid tests normal ------

## 2013-03-20 NOTE — Telephone Encounter (Signed)
As noted below by Dr. Humphrey Rolls, thyroid results are normal. I informed patient that labs are normal.

## 2013-03-27 ENCOUNTER — Emergency Department (HOSPITAL_BASED_OUTPATIENT_CLINIC_OR_DEPARTMENT_OTHER): Payer: No Typology Code available for payment source

## 2013-03-27 ENCOUNTER — Encounter (HOSPITAL_BASED_OUTPATIENT_CLINIC_OR_DEPARTMENT_OTHER): Payer: Self-pay | Admitting: Emergency Medicine

## 2013-03-27 ENCOUNTER — Emergency Department (HOSPITAL_BASED_OUTPATIENT_CLINIC_OR_DEPARTMENT_OTHER)
Admission: EM | Admit: 2013-03-27 | Discharge: 2013-03-27 | Disposition: A | Discharge status: Home / Self Care | Payer: No Typology Code available for payment source | Attending: Emergency Medicine | Admitting: Emergency Medicine

## 2013-03-27 DIAGNOSIS — S339XXA Sprain of unspecified parts of lumbar spine and pelvis, initial encounter: Secondary | ICD-10-CM | POA: Insufficient documentation

## 2013-03-27 DIAGNOSIS — S139XXA Sprain of joints and ligaments of unspecified parts of neck, initial encounter: Secondary | ICD-10-CM | POA: Insufficient documentation

## 2013-03-27 DIAGNOSIS — S298XXA Other specified injuries of thorax, initial encounter: Secondary | ICD-10-CM | POA: Insufficient documentation

## 2013-03-27 DIAGNOSIS — Z85038 Personal history of other malignant neoplasm of large intestine: Secondary | ICD-10-CM | POA: Insufficient documentation

## 2013-03-27 DIAGNOSIS — R51 Headache: Secondary | ICD-10-CM | POA: Insufficient documentation

## 2013-03-27 DIAGNOSIS — Y9389 Activity, other specified: Secondary | ICD-10-CM | POA: Insufficient documentation

## 2013-03-27 DIAGNOSIS — K219 Gastro-esophageal reflux disease without esophagitis: Secondary | ICD-10-CM | POA: Insufficient documentation

## 2013-03-27 DIAGNOSIS — R110 Nausea: Secondary | ICD-10-CM | POA: Insufficient documentation

## 2013-03-27 DIAGNOSIS — S335XXA Sprain of ligaments of lumbar spine, initial encounter: Principal | ICD-10-CM

## 2013-03-27 DIAGNOSIS — S161XXA Strain of muscle, fascia and tendon at neck level, initial encounter: Secondary | ICD-10-CM

## 2013-03-27 DIAGNOSIS — Y92410 Unspecified street and highway as the place of occurrence of the external cause: Secondary | ICD-10-CM | POA: Insufficient documentation

## 2013-03-27 DIAGNOSIS — Z923 Personal history of irradiation: Secondary | ICD-10-CM | POA: Insufficient documentation

## 2013-03-27 DIAGNOSIS — Z79899 Other long term (current) drug therapy: Secondary | ICD-10-CM | POA: Insufficient documentation

## 2013-03-27 DIAGNOSIS — S39012A Strain of muscle, fascia and tendon of lower back, initial encounter: Secondary | ICD-10-CM

## 2013-03-27 DIAGNOSIS — Z853 Personal history of malignant neoplasm of breast: Secondary | ICD-10-CM | POA: Insufficient documentation

## 2013-03-27 DIAGNOSIS — Z87891 Personal history of nicotine dependence: Secondary | ICD-10-CM | POA: Insufficient documentation

## 2013-03-27 MED ORDER — OXYCODONE-ACETAMINOPHEN 5-325 MG PO TABS: 1.0000 | ORAL_TABLET | Freq: Four times a day (QID) | ORAL | Status: DC | PRN

## 2013-03-27 MED ORDER — IBUPROFEN 800 MG PO TABS
800.0000 mg | ORAL_TABLET | Freq: Once | ORAL | Status: AC
  Administered 2013-03-27: 800 mg via ORAL
  Filled 2013-03-27: qty 1

## 2013-03-27 MED ORDER — OXYCODONE-ACETAMINOPHEN 5-325 MG PO TABS
1.0000 | ORAL_TABLET | Freq: Once | ORAL | Status: DC
  Filled 2013-03-27: qty 1

## 2013-03-27 MED ORDER — IBUPROFEN 800 MG PO TABS: 800.0000 mg | ORAL_TABLET | Freq: Three times a day (TID) | ORAL | Status: AC

## 2013-03-27 MED ORDER — CYCLOBENZAPRINE HCL 5 MG PO TABS: 5.0000 mg | ORAL_TABLET | Freq: Three times a day (TID) | ORAL | Status: DC | PRN

## 2013-03-27 NOTE — Discharge Instructions (Signed)
Return to the ED with any concerns including weakness in arms or legs, chest pain, abdominal pain, difficulty breathing, not able to urinate, loss of control of bowel or bladder, or any other alarming symptoms
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
## 2013-08-29 ENCOUNTER — Telehealth: Payer: Self-pay | Admitting: Oncology

## 2013-08-29 NOTE — Telephone Encounter (Signed)
Faxed pt medical records to [facility] °

## 2013-09-18 ENCOUNTER — Telehealth: Payer: Self-pay | Admitting: Oncology

## 2013-09-18 NOTE — Telephone Encounter (Signed)
, °

## 2013-09-26 ENCOUNTER — Other Ambulatory Visit

## 2013-09-26 ENCOUNTER — Ambulatory Visit: Admitting: Oncology

## 2014-04-21 ENCOUNTER — Emergency Department (HOSPITAL_BASED_OUTPATIENT_CLINIC_OR_DEPARTMENT_OTHER)
Admission: EM | Admit: 2014-04-21 | Discharge: 2014-04-21 | Disposition: A | Discharge status: Home / Self Care | Attending: Emergency Medicine | Admitting: Emergency Medicine

## 2014-04-21 ENCOUNTER — Emergency Department (HOSPITAL_BASED_OUTPATIENT_CLINIC_OR_DEPARTMENT_OTHER)

## 2014-04-21 ENCOUNTER — Encounter (HOSPITAL_BASED_OUTPATIENT_CLINIC_OR_DEPARTMENT_OTHER): Payer: Self-pay

## 2014-04-21 DIAGNOSIS — Z8669 Personal history of other diseases of the nervous system and sense organs: Secondary | ICD-10-CM | POA: Diagnosis not present

## 2014-04-21 DIAGNOSIS — Z87891 Personal history of nicotine dependence: Secondary | ICD-10-CM | POA: Diagnosis not present

## 2014-04-21 DIAGNOSIS — Z9889 Other specified postprocedural states: Secondary | ICD-10-CM | POA: Diagnosis not present

## 2014-04-21 DIAGNOSIS — Z79899 Other long term (current) drug therapy: Secondary | ICD-10-CM | POA: Insufficient documentation

## 2014-04-21 DIAGNOSIS — Z923 Personal history of irradiation: Secondary | ICD-10-CM | POA: Diagnosis not present

## 2014-04-21 DIAGNOSIS — Z87448 Personal history of other diseases of urinary system: Secondary | ICD-10-CM | POA: Insufficient documentation

## 2014-04-21 DIAGNOSIS — Z8719 Personal history of other diseases of the digestive system: Secondary | ICD-10-CM | POA: Insufficient documentation

## 2014-04-21 DIAGNOSIS — Z853 Personal history of malignant neoplasm of breast: Secondary | ICD-10-CM | POA: Insufficient documentation

## 2014-04-21 DIAGNOSIS — Z86018 Personal history of other benign neoplasm: Secondary | ICD-10-CM | POA: Insufficient documentation

## 2014-04-21 DIAGNOSIS — Z87442 Personal history of urinary calculi: Secondary | ICD-10-CM | POA: Insufficient documentation

## 2014-04-21 DIAGNOSIS — R1031 Right lower quadrant pain: Secondary | ICD-10-CM | POA: Insufficient documentation

## 2014-04-21 DIAGNOSIS — R109 Unspecified abdominal pain: Secondary | ICD-10-CM | POA: Diagnosis present

## 2014-04-21 HISTORY — DX: Disorder of kidney and ureter, unspecified: N28.9

## 2014-04-21 LAB — CBC WITH DIFFERENTIAL/PLATELET
Basophils Absolute: 0 10*3/uL (ref 0.0–0.1)
Basophils Relative: 0 % (ref 0–1)
EOS PCT: 0 % (ref 0–5)
Eosinophils Absolute: 0 10*3/uL (ref 0.0–0.7)
HCT: 34.9 % — ABNORMAL LOW (ref 36.0–46.0)
Hemoglobin: 11 g/dL — ABNORMAL LOW (ref 12.0–15.0)
LYMPHS ABS: 1 10*3/uL (ref 0.7–4.0)
Lymphocytes Relative: 9 % — ABNORMAL LOW (ref 12–46)
MCH: 30.4 pg (ref 26.0–34.0)
MCHC: 31.5 g/dL (ref 30.0–36.0)
MCV: 96.4 fL (ref 78.0–100.0)
MONO ABS: 0.7 10*3/uL (ref 0.1–1.0)
Monocytes Relative: 6 % (ref 3–12)
Neutro Abs: 9.9 10*3/uL — ABNORMAL HIGH (ref 1.7–7.7)
Neutrophils Relative %: 85 % — ABNORMAL HIGH (ref 43–77)
Platelets: 214 10*3/uL (ref 150–400)
RBC: 3.62 MIL/uL — ABNORMAL LOW (ref 3.87–5.11)
RDW: 14.3 % (ref 11.5–15.5)
WBC: 11.6 10*3/uL — AB (ref 4.0–10.5)

## 2014-04-21 LAB — COMPREHENSIVE METABOLIC PANEL
ALBUMIN: 4.2 g/dL (ref 3.5–5.2)
ALK PHOS: 108 U/L (ref 39–117)
ALT: 21 U/L (ref 0–35)
ANION GAP: 7 (ref 5–15)
AST: 23 U/L (ref 0–37)
BUN: 17 mg/dL (ref 6–23)
CHLORIDE: 103 mmol/L (ref 96–112)
CO2: 26 mmol/L (ref 19–32)
Calcium: 9.5 mg/dL (ref 8.4–10.5)
Creatinine, Ser: 0.73 mg/dL (ref 0.50–1.10)
GFR calc Af Amer: >90 mL/min (ref >90)
GFR calc non Af Amer: >90 mL/min (ref >90)
Glucose, Bld: 118 mg/dL — ABNORMAL HIGH (ref 70–99)
POTASSIUM: 3.9 mmol/L (ref 3.5–5.1)
SODIUM: 136 mmol/L (ref 135–145)
Total Bilirubin: <0.1 mg/dL — ABNORMAL LOW (ref 0.3–1.2)
Total Protein: 7.2 g/dL (ref 6.0–8.3)

## 2014-04-21 LAB — URINALYSIS, ROUTINE W REFLEX MICROSCOPIC
BILIRUBIN URINE: NEGATIVE
Glucose, UA: NEGATIVE mg/dL
Ketones, ur: NEGATIVE mg/dL
Leukocytes, UA: NEGATIVE
Nitrite: NEGATIVE
Protein, ur: NEGATIVE mg/dL
Specific Gravity, Urine: 1.014 (ref 1.005–1.030)
Urobilinogen, UA: 0.2 mg/dL (ref 0.0–1.0)
pH: 6.5 (ref 5.0–8.0)

## 2014-04-21 LAB — URINE MICROSCOPIC-ADD ON

## 2014-04-21 LAB — LIPASE, BLOOD: Lipase: 19 U/L (ref 11–59)

## 2014-04-21 MED ORDER — IOHEXOL 300 MG/ML  SOLN
100.0000 mL | Freq: Once | INTRAMUSCULAR | Status: AC | PRN
  Administered 2014-04-21: 100 mL via INTRAVENOUS

## 2014-04-21 MED ORDER — IOHEXOL 300 MG/ML  SOLN
25.0000 mL | Freq: Once | INTRAMUSCULAR | Status: AC | PRN
  Administered 2014-04-21: 25 mL via ORAL

## 2014-04-21 MED ORDER — SODIUM CHLORIDE 0.9 % IV BOLUS (SEPSIS)
1000.0000 mL | Freq: Once | INTRAVENOUS | Status: AC
  Administered 2014-04-21: 1000 mL via INTRAVENOUS

## 2014-04-21 MED ORDER — ONDANSETRON HCL 4 MG/2ML IJ SOLN
4.0000 mg | Freq: Once | INTRAMUSCULAR | Status: AC
  Administered 2014-04-21: 4 mg via INTRAVENOUS
  Filled 2014-04-21: qty 2

## 2014-04-21 MED ORDER — HYDROMORPHONE HCL 1 MG/ML IJ SOLN
1.0000 mg | Freq: Once | INTRAMUSCULAR | Status: AC
  Administered 2014-04-21: 1 mg via INTRAVENOUS
  Filled 2014-04-21: qty 1

## 2014-04-21 NOTE — Discharge Instructions (Signed)
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
## 2014-04-21 NOTE — ED Notes (Signed)
Pt c/o rt lower back pain radiating to front groin area; states hx of kidney stone, feels the same; c/o nausea/vomiting; states no difficulty urinated

## 2015-02-13 ENCOUNTER — Other Ambulatory Visit: Payer: Self-pay | Admitting: Gynecology

## 2021-01-15 ENCOUNTER — Emergency Department (HOSPITAL_BASED_OUTPATIENT_CLINIC_OR_DEPARTMENT_OTHER): Payer: Medicare Other | Admitting: Radiology

## 2021-01-15 ENCOUNTER — Other Ambulatory Visit: Payer: Self-pay

## 2021-01-15 ENCOUNTER — Emergency Department (HOSPITAL_BASED_OUTPATIENT_CLINIC_OR_DEPARTMENT_OTHER): Payer: Medicare Other

## 2021-01-15 ENCOUNTER — Encounter (HOSPITAL_BASED_OUTPATIENT_CLINIC_OR_DEPARTMENT_OTHER): Payer: Self-pay | Admitting: *Deleted

## 2021-01-15 ENCOUNTER — Emergency Department (HOSPITAL_BASED_OUTPATIENT_CLINIC_OR_DEPARTMENT_OTHER)
Admission: EM | Admit: 2021-01-15 | Discharge: 2021-01-15 | Disposition: A | Discharge status: Home / Self Care | Payer: Medicare Other | Attending: Emergency Medicine | Admitting: Emergency Medicine

## 2021-01-15 DIAGNOSIS — Z85038 Personal history of other malignant neoplasm of large intestine: Secondary | ICD-10-CM | POA: Diagnosis not present

## 2021-01-15 DIAGNOSIS — S060X0A Concussion without loss of consciousness, initial encounter: Secondary | ICD-10-CM | POA: Insufficient documentation

## 2021-01-15 DIAGNOSIS — Z87891 Personal history of nicotine dependence: Secondary | ICD-10-CM | POA: Insufficient documentation

## 2021-01-15 DIAGNOSIS — Z853 Personal history of malignant neoplasm of breast: Secondary | ICD-10-CM | POA: Diagnosis not present

## 2021-01-15 DIAGNOSIS — S0990XA Unspecified injury of head, initial encounter: Secondary | ICD-10-CM | POA: Diagnosis present

## 2021-01-15 DIAGNOSIS — W01198A Fall on same level from slipping, tripping and stumbling with subsequent striking against other object, initial encounter: Secondary | ICD-10-CM | POA: Insufficient documentation

## 2021-01-15 MED ORDER — OXYCODONE HCL 5 MG PO TABS
5.0000 mg | ORAL_TABLET | Freq: Once | ORAL | Status: AC
  Administered 2021-01-15: 5 mg via ORAL
  Filled 2021-01-15: qty 1

## 2021-01-15 MED ORDER — ACETAMINOPHEN 500 MG PO TABS
1000.0000 mg | ORAL_TABLET | Freq: Once | ORAL | Status: AC
  Administered 2021-01-15: 1000 mg via ORAL
  Filled 2021-01-15: qty 2
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
## 2021-01-15 NOTE — Discharge Instructions (Signed)
Your CT scan of your head and neck without obvious injury.  The x-ray of your back did not show any broken bones.  Please follow-up with your family doctor in the office.  Please return for confusion, vomiting.

## 2021-01-15 NOTE — ED Provider Notes (Signed)
[facility] EMERGENCY DEPT Provider Note   CSN: [id number] Arrival date & time: 01/15/21  1507     History Chief Complaint  Patient presents with   Toni Randolph    Toni Randolph is a 65 y.o. female.  65 yo F with a chief complaint of a fall.  Patient had just pain in her nails and was wearing very slippery footwear and lost her balance and fell and struck the back of her head on the ground.  Since that has been having a mild headache is also had some changes to her vision off and on.  Headache is worse with bright lights and loud noises.  Has some mild neck pain with this as well.  Also some pain to her lower back.  The history is provided by the patient and the spouse.  Fall Associated symptoms include headaches. Pertinent negatives include no chest pain and no shortness of breath.  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   GERD (gastroesophageal reflux disease)    History of radiation therapy 06/05/11- 07/28/11   right breast 5040 cGy in 28 fx   Renal disorder    Sleep apnea     Patient Active Problem List   Diagnosis Date Noted   Breast cancer [facility])    History of radiation therapy    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     Family History  Problem Relation Age of Onset    Coronary artery disease Other    Diabetes Other     Social History   Tobacco Use   Smoking status: Former    Packs/day: 0.20    Types: Cigarettes    Quit date: 05/14/1981    Years since quitting: 39.7   Smokeless tobacco: Never  Vaping Use   Vaping Use: Never used  Substance Use Topics   Alcohol use: No   Drug use: No    Home Medications Prior to Admission medications   Medication Sig Start Date End Date Taking? Authorizing Provider  cyclobenzaprine (FLEXERIL) 5 MG tablet Take 1 tablet (5 mg total) by mouth 3 (three) times daily as needed for muscle spasms. 03/27/13   Mabe, Forbes Cellar, MD  dexlansoprazole (DEXILANT) 60 MG capsule Take 60 mg by mouth daily.    [provider]  ibuprofen (ADVIL,MOTRIN) 800 MG tablet Take 1 tablet (800 mg total) by mouth 3 (three) times daily. 03/27/13   Mabe, Forbes Cellar, MD  ondansetron (ZOFRAN) 8 MG tablet Take 1 tablet (8 mg total) by mouth every 8 (eight) hours as needed for nausea. 03/17/13   Marcy Panning, MD  oxyCODONE-acetaminophen (PERCOCET/ROXICET) 5-325 MG per tablet Take 1-2 tablets by mouth every 6 (six) hours as needed for severe pain. 03/27/13  Mabe, Forbes Cellar, MD  tamoxifen (NOLVADEX) 20 MG tablet Take 1 tablet (20 mg total) by mouth daily. 03/17/13   Marcy Panning, MD    Allergies    Codeine  Review of Systems   Review of Systems  Constitutional:  Negative for chills and fever.  HENT:  Negative for congestion and rhinorrhea.   Eyes:  Positive for visual disturbance. Negative for redness.  Respiratory:  Negative for shortness of breath and wheezing.   Cardiovascular:  Negative for chest pain and palpitations.  Gastrointestinal:  Negative for nausea and vomiting.  Genitourinary:  Negative for dysuria and urgency.  Musculoskeletal:  Negative for arthralgias and myalgias.  Skin:  Negative for pallor and wound.  Neurological:  Positive for headaches. Negative for dizziness.   Physical Exam Updated Vital Signs BP (!) 144/62  (BP Location: Right Arm)    Pulse (!) 56    Temp 98.7 F (37.1 C) (Oral)    Resp 16    Ht 5\' 9"  (1.753 m)    Wt 95.3 kg    SpO2 98%    BMI 31.01 kg/m   Physical Exam Vitals and nursing note reviewed.  Constitutional:      General: She is not in acute distress.    Appearance: She is well-developed. She is not diaphoretic.  HENT:     Head: Normocephalic and atraumatic.  Eyes:     Pupils: Pupils are equal, round, and reactive to light.  Cardiovascular:     Rate and Rhythm: Normal rate and regular rhythm.     Heart sounds: No murmur heard.   No friction rub. No gallop.  Pulmonary:     Effort: Pulmonary effort is normal.     Breath sounds: No wheezing or rales.  Abdominal:     General: There is no distension.     Palpations: Abdomen is soft.     Tenderness: There is no abdominal tenderness.  Musculoskeletal:        General: Tenderness present.     Cervical back: Normal range of motion and neck supple.     Comments: Mild pain along the midline C-spine.  Able to range her head 45 degrees in either direction without significant discomfort.  Skin:    General: Skin is warm and dry.  Neurological:     Mental Status: She is alert and oriented to person, place, and time.  Psychiatric:        Behavior: Behavior normal.    ED Results / Procedures / Treatments   Labs (all labs ordered are listed, but only abnormal results are displayed) Labs Reviewed - No data to display  EKG None  Radiology DG Lumbar Spine Complete  Result Date: 01/15/2021 CLINICAL DATA:  Status post fall. EXAM: LUMBAR SPINE - COMPLETE 4+ VIEW COMPARISON:  March 27, 2013 FINDINGS: There is no evidence of lumbar spine fracture. Alignment is normal. Mild endplate sclerosis and anterior osteophyte formation is seen at the levels of L3-L4 and L4-L5, with mild to moderate severity endplate sclerosis noted at the level of L5-S1. Intervertebral disc spaces are maintained. IMPRESSION: 1. No acute findings. 2. Mild to  moderate degenerative disc disease, most prominent at L5-S1. Electronically Signed   By: Virgina Norfolk M.D.   On: 01/15/2021 19:52   CT Head Wo Contrast  Result Date: 01/15/2021 CLINICAL DATA:  Status post fall. EXAM: CT HEAD WITHOUT CONTRAST TECHNIQUE: Contiguous axial images were obtained from the base of the skull through the vertex without intravenous contrast. COMPARISON:  None.  FINDINGS: Brain: There is mild cerebral atrophy with widening of the extra-axial spaces and ventricular dilatation. There are areas of decreased attenuation within the white matter tracts of the supratentorial brain, consistent with microvascular disease changes. Vascular: No hyperdense vessel or unexpected calcification. Skull: Normal. Negative for fracture or focal lesion. Sinuses/Orbits: No acute finding. Other: None. IMPRESSION: 1. Mild cerebral atrophy and microvascular disease changes of the supratentorial brain. 2. No acute intracranial abnormality. Electronically Signed   By: Virgina Norfolk M.D.   On: 01/15/2021 19:43   CT Cervical Spine Wo Contrast  Result Date: 01/15/2021 CLINICAL DATA:  Status post fall. EXAM: CT CERVICAL SPINE WITHOUT CONTRAST TECHNIQUE: Multidetector CT imaging of the cervical spine was performed without intravenous contrast. Multiplanar CT image reconstructions were also generated. COMPARISON:  None. FINDINGS: Alignment: Normal. Skull base and vertebrae: No acute fracture. A 5 mm bone island is seen adjacent to the posterior aspect of the base of the dens on the left. Soft tissues and spinal canal: No prevertebral fluid or swelling. No visible canal hematoma. Disc levels: Mild to moderate severity endplate sclerosis is seen at the level of C5-C6. There is moderate severity narrowing of the anterior atlantal articulation. Mild to moderate severity intervertebral disc space narrowing is seen at the level of C5-C6. Mild, bilateral multilevel facet joint hypertrophy is noted. Upper chest:  Negative. Other: None. IMPRESSION: 1. No evidence of an acute fracture or subluxation. 2. Mild to moderate severity degenerative changes at the level of C5-C6. Electronically Signed   By: Virgina Norfolk M.D.   On: 01/15/2021 19:47    Procedures Procedures   Medications Ordered in ED Medications  acetaminophen (TYLENOL) tablet 1,000 mg (1,000 mg Oral Given 01/15/21 1910)  oxyCODONE (Oxy IR/ROXICODONE) immediate release tablet 5 mg (5 mg Oral Given 01/15/21 1910)    ED Course  I have reviewed the triage vital signs and the nursing notes.  Pertinent labs & imaging results that were available during my care of the patient were reviewed by me and considered in my medical decision making (see chart for details).    MDM Rules/Calculators/A&P                         65 yo F with a chief complaints of a fall.  Nonsyncopal by history.  Complaining mostly of headache and neck pain.  Will obtain CT scans.  CT head and C-spine negative.  Plain film of the L-spine viewed by me without fracture.  Will discharge home.  PCP follow-up.  9:50 PM:  I have discussed the diagnosis/risks/treatment options with the patient and family and believe the pt to be eligible for discharge home to follow-up with PCP. We also discussed returning to the ED immediately if new or worsening sx occur. We discussed the sx which are most concerning (e.g., sudden worsening pain, fever, inability to tolerate by mouth) that necessitate immediate return. Medications administered to the patient during their visit and any new prescriptions provided to the patient are listed below.  Medications given during this visit Medications  acetaminophen (TYLENOL) tablet 1,000 mg (1,000 mg Oral Given 01/15/21 1910)  oxyCODONE (Oxy IR/ROXICODONE) immediate release tablet 5 mg (5 mg Oral Given 01/15/21 1910)     The patient appears reasonably screen and/or stabilized for discharge and I doubt any other medical condition or other [facility]  requiring further screening, evaluation, or treatment in the ED at this time prior to discharge.      Final Clinical  Impression(s) / ED Diagnoses Final diagnoses:  Concussion without loss of consciousness, initial encounter    Rx / DC Orders ED Discharge Orders     None        Deno Etienne, DO 01/15/21 2150

## 2021-07-20 ENCOUNTER — Encounter (HOSPITAL_COMMUNITY): Payer: Self-pay

## 2021-07-20 ENCOUNTER — Emergency Department (HOSPITAL_COMMUNITY): Payer: Medicare Other

## 2021-07-20 ENCOUNTER — Other Ambulatory Visit: Payer: Self-pay

## 2021-07-20 ENCOUNTER — Emergency Department (HOSPITAL_COMMUNITY)
Admission: EM | Admit: 2021-07-20 | Discharge: 2021-07-20 | Disposition: A | Discharge status: Home / Self Care | Payer: Medicare Other | Attending: Emergency Medicine | Admitting: Emergency Medicine

## 2021-07-20 DIAGNOSIS — R0600 Dyspnea, unspecified: Secondary | ICD-10-CM | POA: Insufficient documentation

## 2021-07-20 DIAGNOSIS — R110 Nausea: Secondary | ICD-10-CM | POA: Insufficient documentation

## 2021-07-20 DIAGNOSIS — R7989 Other specified abnormal findings of blood chemistry: Secondary | ICD-10-CM | POA: Insufficient documentation

## 2021-07-20 DIAGNOSIS — R079 Chest pain, unspecified: Secondary | ICD-10-CM | POA: Diagnosis present

## 2021-07-20 LAB — COMPREHENSIVE METABOLIC PANEL
ALT: 14 U/L (ref 0–44)
AST: 20 U/L (ref 15–41)
Albumin: 3.9 g/dL (ref 3.5–5.0)
Alkaline Phosphatase: 79 U/L (ref 38–126)
Anion gap: 9 (ref 5–15)
BUN: 21 mg/dL (ref 8–23)
CO2: 22 mmol/L (ref 22–32)
Calcium: 9.9 mg/dL (ref 8.9–10.3)
Chloride: 108 mmol/L (ref 98–111)
Creatinine, Ser: 1.11 mg/dL — ABNORMAL HIGH (ref 0.44–1.00)
GFR, Estimated: 54 mL/min — ABNORMAL LOW (ref >60)
Glucose, Bld: 109 mg/dL — ABNORMAL HIGH (ref 70–99)
Potassium: 3.8 mmol/L (ref 3.5–5.1)
Sodium: 139 mmol/L (ref 135–145)
Total Bilirubin: 0.6 mg/dL (ref 0.3–1.2)
Total Protein: 6.7 g/dL (ref 6.5–8.1)

## 2021-07-20 LAB — CBC
HCT: 34.9 % — ABNORMAL LOW (ref 36.0–46.0)
Hemoglobin: 11.1 g/dL — ABNORMAL LOW (ref 12.0–15.0)
MCH: 30.6 pg (ref 26.0–34.0)
MCHC: 31.8 g/dL (ref 30.0–36.0)
MCV: 96.1 fL (ref 80.0–100.0)
Platelets: 236 10*3/uL (ref 150–400)
RBC: 3.63 MIL/uL — ABNORMAL LOW (ref 3.87–5.11)
RDW: 14.3 % (ref 11.5–15.5)
WBC: 8 10*3/uL (ref 4.0–10.5)
nRBC: 0 % (ref 0.0–0.2)

## 2021-07-20 LAB — D-DIMER, QUANTITATIVE: D-Dimer, Quant: 0.59 ug/mL-FEU — ABNORMAL HIGH (ref 0.00–0.50)

## 2021-07-20 LAB — TROPONIN I (HIGH SENSITIVITY)
Troponin I (High Sensitivity): 8 ng/L (ref <18)
Troponin I (High Sensitivity): 13 ng/L (ref <18)

## 2021-07-20 MED ORDER — ALUM & MAG HYDROXIDE-SIMETH 200-200-20 MG/5ML PO SUSP
30.0000 mL | Freq: Once | ORAL | Status: AC
  Administered 2021-07-20: 30 mL via ORAL
  Filled 2021-07-20: qty 30

## 2021-07-20 MED ORDER — IOHEXOL 350 MG/ML SOLN
44.0000 mL | Freq: Once | INTRAVENOUS | Status: AC | PRN
  Administered 2021-07-20: 44 mL via INTRAVENOUS

## 2021-07-20 NOTE — Discharge Instructions (Signed)
Call your doctor for recheck Return if you are having any new or worsening symptoms

## 2021-07-20 NOTE — ED Triage Notes (Signed)
Was walking and started having CP.  Reports was walking outside 1.5 miles and the last 30 mins chest pain started. Causing nausea and feeling fatigue. Complains right fingers became tingling.  Still complains of tightness.

## 2021-07-20 NOTE — ED Provider Notes (Addendum)
[place] EMERGENCY DEPARTMENT Provider Note   CSN: [id number] Arrival date & time: 07/20/21  1024     History  Chief Complaint  Patient presents with  . Chest Pain    Toni Randolph is a 66 y.o. female.  HPI 66 year old female presents today via EMS with complaints of chest pain.  Patient states she was outside walking when she began having some substernal chest pain.  It was sudden, severe, pressure that radiated with some tingling to her right fingers.  Felt lightheaded and laid down.  She became nauseated had some dyspnea associated with this.  Pain lasted about 1 minute.  EMS was called immediately.  They gave her baby aspirin prehospital.  Pain has subsided prior to EMS arriving.  Prehospital tracing is reviewed and shows a heart rate of approximately 90 with no evidence of acute ST changes.  Patient denies any prior cardiac history.  She has no risk factors.  She does have a history of breast cancer and is 10 years out today from treatment.  Her father had an MI at age 36.  She does not smoke, drink, no hypertension, no hyperlipidemia    Home Medications Prior to Admission medications   Medication Sig Start Date End Date Taking? Authorizing Provider  cyclobenzaprine (FLEXERIL) 5 MG tablet Take 1 tablet (5 mg total) by mouth 3 (three) times daily as needed for muscle spasms. 03/27/13   Mabe, Forbes Cellar, MD  dexlansoprazole (DEXILANT) 60 MG capsule Take 60 mg by mouth daily.    [provider]  ibuprofen (ADVIL,MOTRIN) 800 MG tablet Take 1 tablet (800 mg total) by mouth 3 (three) times daily. 03/27/13   Mabe, Forbes Cellar, MD  ondansetron (ZOFRAN) 8 MG tablet Take 1 tablet (8 mg total) by mouth every 8 (eight) hours as needed for nausea. 03/17/13   Marcy Panning, MD  oxyCODONE-acetaminophen (PERCOCET/ROXICET) 5-325 MG per tablet Take 1-2 tablets by mouth every 6 (six) hours as needed for severe pain. 03/27/13   Mabe, Forbes Cellar, MD  tamoxifen (NOLVADEX) 20 MG tablet  Take 1 tablet (20 mg total) by mouth daily. 03/17/13   Marcy Panning, MD      Allergies    Nickel, Ursodiol, and Codeine    Review of Systems   Review of Systems  Physical Exam Updated Vital Signs BP 122/90   Pulse (!) 57   Temp 98.4 F (36.9 C) (Oral)   Resp 16   Ht 1.753 m ('5\' 9"'$ )   Wt 95.3 kg   SpO2 99%   BMI 31.01 kg/m  Physical Exam Vitals and nursing note reviewed.  Constitutional:      Appearance: She is well-developed. She is obese.  HENT:     Head: Normocephalic.  Eyes:     Pupils: Pupils are equal, round, and reactive to light.  Cardiovascular:     Rate and Rhythm: Normal rate and regular rhythm.     Heart sounds: Normal heart sounds.  Pulmonary:     Effort: Pulmonary effort is normal.     Breath sounds: Normal breath sounds.  Abdominal:     General: Bowel sounds are normal.     Palpations: Abdomen is soft.  Musculoskeletal:        General: Normal range of motion.     Cervical back: Normal range of motion.     Right lower leg: No tenderness. No edema.     Left lower leg: No tenderness. No edema.  Skin:    General:  Skin is warm.     Capillary Refill: Capillary refill takes less than 2 seconds.  Neurological:     General: No focal deficit present.     Mental Status: She is alert.     ED Results / Procedures / Treatments   Labs (all labs ordered are listed, but only abnormal results are displayed) Labs Reviewed  CBC - Abnormal; Notable for the following components:      Result Value   RBC 3.63 (*)    Hemoglobin 11.1 (*)    HCT 34.9 (*)    All other components within normal limits  COMPREHENSIVE METABOLIC PANEL - Abnormal; Notable for the following components:   Glucose, Bld 109 (*)    Creatinine, Ser 1.11 (*)    GFR, Estimated 54 (*)    All other components within normal limits  D-DIMER, QUANTITATIVE - Abnormal; Notable for the following components:   D-Dimer, Quant 0.59 (*)    All other components within normal limits  TROPONIN I (HIGH  SENSITIVITY)  TROPONIN I (HIGH SENSITIVITY)    EKG None  Radiology CT Angio Chest PE W and/or Wo Contrast  Result Date: 07/20/2021 CLINICAL DATA:  Chest pain. Elevated D-dimer. Concern for pulmonary embolism EXAM: CT ANGIOGRAPHY CHEST WITH CONTRAST TECHNIQUE: Multidetector CT imaging of the chest was performed using the standard protocol during bolus administration of intravenous contrast. Multiplanar CT image reconstructions and MIPs were obtained to evaluate the vascular anatomy. RADIATION DOSE REDUCTION: This exam was performed according to the departmental dose-optimization program which includes automated exposure control, adjustment of the mA and/or kV according to patient size and/or use of iterative reconstruction technique. CONTRAST:  53m OMNIPAQUE IOHEXOL 350 MG/ML SOLN COMPARISON:  None Available. FINDINGS: Cardiovascular: No filling defects within the pulmonary arteries to suggest acute pulmonary embolism. Mediastinum/Nodes: No axillary or supraclavicular adenopathy. No mediastinal or hilar adenopathy. No pericardial fluid. Esophagus normal. Lungs/Pleura: Subtle ground-glass densities in the RIGHT upper lobe (image 35/8). No pulmonary infarction. No pleural fluid. No pneumothorax. Upper Abdomen: Limited view of the liver, kidneys, pancreas are unremarkable. Normal adrenal glands. Post bariatric surgery anatomy. Musculoskeletal: No chest wall abnormality. No acute or significant osseous findings. Review of the MIP images confirms the above findings. IMPRESSION: 1. No evidence of acute pulmonary embolism. 2. Subtle ground-glass density in the RIGHT upper lobe suggest atelectasis or mild pulmonary infection. Electronically Signed   By: SSuzy BouchardM.D.   On: 07/20/2021 13:36   DG Chest Port 1 View  Result Date: 07/20/2021 CLINICAL DATA:  Chest pain. EXAM: PORTABLE CHEST 1 VIEW COMPARISON:  Chest radiograph 07/27/2004. FINDINGS: No consolidation. No visible pleural effusions or  pneumothorax. Similar enlargement of the cardiac silhouette. No displaced fracture. IMPRESSION: 1. No evidence of acute abnormality. 2. Cardiomegaly. Electronically Signed   By: FMargaretha SheffieldM.D.   On: 07/20/2021 11:20    Procedures Procedures    Medications Ordered in ED Medications  alum & mag hydroxide-simeth (MAALOX/MYLANTA) 200-200-20 MG/5ML suspension 30 mL (30 mLs Oral Given 07/20/21 1046)  iohexol (OMNIPAQUE) 350 MG/ML injection 44 mL (44 mLs Intravenous Contrast Given 07/20/21 1304)    ED Course/ Medical Decision Making/ A&P Clinical Course as of 07/20/21 1429  Wed Jul 20, 2021  1130 D-dimer slightly elevated at 0.59 [DR]  1130 CBC reviewed interpreted and stable anemia noted [DR]  1131 This x-Shateka Petrea reviewed interpreted no evidence of acute abnormality noted radiologist interpretation is cardiomegaly [DR]  1428 CT chest reviewed interpreted limits of acute abnormality per radiologist interpretation subtle  groundglass appearance right upper lobe could represent early infection based on patient's symptoms this likely represents some mild atelectasis and I have a low index of suspicion for infection and would not start antibiotics at this time [DR]    Clinical Course User Index [DR] Pattricia Boss, MD                           Medical Decision Making Patient seen and evaluated for chest pain.  Differential diagnosis of serious/life threatening causes of chest pain includes ACS, other diseases of the heart such as myocarditis or pericarditis, lung etiologies such as infection or pneumothorax, diseases of the great vessels such as aortic dissection or AAA, pulmonary embolism, or GI sources such as cholecystitis or other upper abdominal causes. Doubt ACS- heart score documented, EKG reviewed, Given the timing of pain to ER presentation, delta troponin 8 was not sidnificantly changed with repeat 13 so doubt NSTEMI troponin and repeat troponin obtained and WNL Doubt  myocarditis/pericarditis/tamponade based on history, review of ekg and labs Doubt aortic dissection based on history and review of imaging Doubt intrinsic lung causes such as pneumonia or pneumothorax, based on history, physical exam, and studies obtained. Doubt PE based on history, physical exam, and and cta Doubt acute GI etiology requiring intervention based on history, physical exam and labs. Patient appears stable for discharge. Return precautions and need for follow up discussed and patient voices understanding   Amount and/or Complexity of Data Reviewed Labs: ordered. Radiology: ordered.  Risk OTC drugs. Prescription drug management.           Final Clinical Impression(s) / ED Diagnoses Final diagnoses:  Chest pain, unspecified type    Rx / DC Orders ED Discharge Orders     None         Pattricia Boss, MD 07/20/21 1414    Pattricia Boss, MD 07/20/21 1429

## 2023-04-30 ENCOUNTER — Ambulatory Visit (INDEPENDENT_AMBULATORY_CARE_PROVIDER_SITE_OTHER): Payer: Self-pay

## 2023-04-30 DIAGNOSIS — Z719 Counseling, unspecified: Secondary | ICD-10-CM

## 2023-09-19 ENCOUNTER — Emergency Department (HOSPITAL_BASED_OUTPATIENT_CLINIC_OR_DEPARTMENT_OTHER)
Admission: EM | Admit: 2023-09-19 | Discharge: 2023-09-19 | Disposition: A | Discharge status: Home / Self Care | Attending: Emergency Medicine | Admitting: Emergency Medicine

## 2023-09-19 ENCOUNTER — Encounter (HOSPITAL_BASED_OUTPATIENT_CLINIC_OR_DEPARTMENT_OTHER): Payer: Self-pay

## 2023-09-19 ENCOUNTER — Other Ambulatory Visit: Payer: Self-pay

## 2023-09-19 ENCOUNTER — Emergency Department (HOSPITAL_BASED_OUTPATIENT_CLINIC_OR_DEPARTMENT_OTHER)

## 2023-09-19 DIAGNOSIS — N132 Hydronephrosis with renal and ureteral calculous obstruction: Secondary | ICD-10-CM | POA: Diagnosis not present

## 2023-09-19 DIAGNOSIS — R109 Unspecified abdominal pain: Secondary | ICD-10-CM | POA: Diagnosis present

## 2023-09-19 DIAGNOSIS — N23 Unspecified renal colic: Secondary | ICD-10-CM

## 2023-09-19 LAB — COMPREHENSIVE METABOLIC PANEL WITH GFR
ALT: 16 U/L (ref 0–44)
AST: 22 U/L (ref 15–41)
Albumin: 4.7 g/dL (ref 3.5–5.0)
Alkaline Phosphatase: 88 U/L (ref 38–126)
Anion gap: 14 (ref 5–15)
BUN: 28 mg/dL — ABNORMAL HIGH (ref 8–23)
CO2: 22 mmol/L (ref 22–32)
Calcium: 10.7 mg/dL — ABNORMAL HIGH (ref 8.9–10.3)
Chloride: 102 mmol/L (ref 98–111)
Creatinine, Ser: 1.32 mg/dL — ABNORMAL HIGH (ref 0.44–1.00)
GFR, Estimated: 43 mL/min — ABNORMAL LOW (ref >60)
Glucose, Bld: 109 mg/dL — ABNORMAL HIGH (ref 70–99)
Potassium: 3.6 mmol/L (ref 3.5–5.1)
Sodium: 138 mmol/L (ref 135–145)
Total Bilirubin: 0.4 mg/dL (ref 0.0–1.2)
Total Protein: 7.1 g/dL (ref 6.5–8.1)

## 2023-09-19 LAB — CBC WITH DIFFERENTIAL/PLATELET
Abs Immature Granulocytes: 0.03 K/uL (ref 0.00–0.07)
Basophils Absolute: 0 K/uL (ref 0.0–0.1)
Basophils Relative: 0 %
Eosinophils Absolute: 0 K/uL (ref 0.0–0.5)
Eosinophils Relative: 0 %
HCT: 33.5 % — ABNORMAL LOW (ref 36.0–46.0)
Hemoglobin: 11.3 g/dL — ABNORMAL LOW (ref 12.0–15.0)
Immature Granulocytes: 0 %
Lymphocytes Relative: 8 %
Lymphs Abs: 0.9 K/uL (ref 0.7–4.0)
MCH: 33.5 pg (ref 26.0–34.0)
MCHC: 33.7 g/dL (ref 30.0–36.0)
MCV: 99.4 fL (ref 80.0–100.0)
Monocytes Absolute: 0.5 K/uL (ref 0.1–1.0)
Monocytes Relative: 5 %
Neutro Abs: 8.7 K/uL — ABNORMAL HIGH (ref 1.7–7.7)
Neutrophils Relative %: 87 %
Platelets: 195 K/uL (ref 150–400)
RBC: 3.37 MIL/uL — ABNORMAL LOW (ref 3.87–5.11)
RDW: 14 % (ref 11.5–15.5)
WBC: 10.1 K/uL (ref 4.0–10.5)
nRBC: 0 % (ref 0.0–0.2)

## 2023-09-19 LAB — URINALYSIS, ROUTINE W REFLEX MICROSCOPIC
Bilirubin Urine: NEGATIVE
Glucose, UA: NEGATIVE mg/dL
Hgb urine dipstick: NEGATIVE
Leukocytes,Ua: NEGATIVE
Nitrite: NEGATIVE
Protein, ur: NEGATIVE mg/dL
Specific Gravity, Urine: 1.016 (ref 1.005–1.030)
pH: 8 (ref 5.0–8.0)

## 2023-09-19 LAB — LIPASE, BLOOD: Lipase: 18 U/L (ref 11–51)

## 2023-09-19 MED ORDER — ONDANSETRON 4 MG PO TBDP: 4.0000 mg | ORAL_TABLET | Freq: Three times a day (TID) | ORAL | 0 refills | Status: AC | PRN

## 2023-09-19 MED ORDER — HYDROCODONE-ACETAMINOPHEN 5-325 MG PO TABS
1.0000 | ORAL_TABLET | Freq: Four times a day (QID) | ORAL | 0 refills | Status: AC | PRN
Start: 2023-09-19 — End: ?

## 2023-09-19 MED ORDER — TAMSULOSIN HCL 0.4 MG PO CAPS
0.4000 mg | ORAL_CAPSULE | Freq: Every day | ORAL | 0 refills | Status: AC
Start: 2023-09-19 — End: ?

## 2023-09-19 MED ORDER — MORPHINE SULFATE (PF) 4 MG/ML IV SOLN
4.0000 mg | Freq: Once | INTRAVENOUS | Status: AC
  Administered 2023-09-19: 4 mg via INTRAVENOUS
  Filled 2023-09-19: qty 1

## 2023-09-19 MED ORDER — ONDANSETRON HCL 4 MG/2ML IJ SOLN
4.0000 mg | Freq: Once | INTRAMUSCULAR | Status: AC
  Administered 2023-09-19: 4 mg via INTRAVENOUS
  Filled 2023-09-19: qty 2

## 2023-09-19 MED ORDER — LACTATED RINGERS IV BOLUS
1000.0000 mL | Freq: Once | INTRAVENOUS | Status: AC
  Administered 2023-09-19: 1000 mL via INTRAVENOUS

## 2023-09-19 NOTE — ED Triage Notes (Signed)
 Left sided flank pain that manifested ~midnight with nausea and vomiting.   Says she has a history of renal colic and UTI's.

## 2023-09-19 NOTE — ED Provider Notes (Signed)
 [place] [facility]ERG[facility] DEPARTMENT AT [facility]  Provider Note  CSN: [id number] Arrival date & time: 09/19/23 0219  History Chief Complaint  Patient presents with   Flank Pain    Toni Randolph is a 68 y.o. female with history of renal stones reports sudden onset of R flank pain around midnight. Some dysuria. Symptoms associated with vomiting. She has had UTI/Pyelo in the past as well.    Home Medications Prior to Admission medications   Medication Sig Start Date End Date Taking? Authorizing Provider  HYDROcodone -acetaminophen  (NORCO/VICODIN) 5-325 MG tablet Take 1 tablet by mouth every 6 (six) hours as needed for severe pain (pain score 7-10). 09/19/23  Yes Roselyn Carlin NOVAK, MD  ondansetron  (ZOFRAN -ODT) 4 MG disintegrating tablet Take 1 tablet (4 mg total) by mouth every 8 (eight) hours as needed for nausea or vomiting. 09/19/23  Yes Roselyn Carlin NOVAK, MD  tamsulosin  (FLOMAX ) 0.4 MG CAPS capsule Take 1 capsule (0.4 mg total) by mouth daily. 09/19/23  Yes Roselyn Carlin NOVAK, MD  dexlansoprazole (DEXILANT) 60 MG capsule Take 60 mg by mouth daily.    [provider]  ibuprofen  (ADVIL ,MOTRIN ) 800 MG tablet Take 1 tablet (800 mg total) by mouth 3 (three) times daily. 03/27/13   Mabe, [place] CROME, MD  tamoxifen  (NOLVADEX ) 20 MG tablet Take 1 tablet (20 mg total) by mouth daily. 03/17/13   Khan, Kalsoom, MD     Allergies    Nickel, Ursodiol, and Codeine   Review of Systems   Review of Systems Please see HPI for pertinent positives and negatives  Physical Exam BP (!) 177/77   Pulse 73   Temp 98.4 F (36.9 C)   Resp 18   Ht 5' 9 (1.753 m)   Wt 83.9 kg   SpO2 98%   BMI 27.32 kg/m   Physical Exam Vitals and nursing note reviewed.  Constitutional:      Appearance: Normal appearance.  HENT:     Head: Normocephalic and atraumatic.     Nose: Nose normal.     Mouth/Throat:     Mouth: Mucous membranes are moist.  Eyes:     Extraocular Movements: Extraocular  movements intact.     Conjunctiva/sclera: Conjunctivae normal.  Cardiovascular:     Rate and Rhythm: Normal rate.  Pulmonary:     Effort: Pulmonary effort is normal.     Breath sounds: Normal breath sounds.  Abdominal:     General: Abdomen is flat.     Palpations: Abdomen is soft.     Tenderness: There is no abdominal tenderness. There is no guarding.  Musculoskeletal:        General: No swelling. Normal range of motion.     Cervical back: Neck supple.  Skin:    General: Skin is warm and dry.  Neurological:     General: No focal deficit present.     Mental Status: She is alert.  Psychiatric:        Mood and Affect: Mood normal.     ED Results / Procedures / Treatments   EKG None  Procedures Procedures  Medications Ordered in the ED Medications  ondansetron  (ZOFRAN ) injection 4 mg (4 mg Intravenous Given 09/19/23 0336)  morphine  (PF) 4 MG/ML injection 4 mg (4 mg Intravenous Given 09/19/23 0401)  lactated ringers  bolus 1,000 mL (1,000 mLs Intravenous New Bag/Given 09/19/23 0406)    Initial Impression and Plan  Patient with known history of renal stone and UTI here with flank pain most consistent  with renal colic. Will check labs, send for CT. Pain/nausea meds for comfort.   ED Course   Clinical Course as of 09/19/23 0534  Wed Sep 19, 2023  0356 CBC is unremarkable. CMP with mildly increased Cr compared to previous, otherwise unremarkable.  [CS]  0449 UA is clear.  [CS]  0449 Lipase is normal.  [CS]  0529 I personally viewed the images from radiology studies and agree with radiologist interpretation: CT shows left sided hydronephrosis, likely from a distal stone, although clearly identifying the culprit calcification is difficult. Regardless, her pain is well controlled. She does not have any signs of infection. Will d/c with Rx for Norco, Zofran  and Flomax . She will contact her Urologist when she gets home to [place] ; her CT was given to her on CD to take with her. RTED in  the meantime, preferably [facility], for any other concerns [CS]    Clinical Course User Index [CS] Roselyn Carlin NOVAK, MD     MDM Rules/Calculators/A&P Medical Decision Making Given presenting complaint, I considered that admission might be necessary. After review of results from ED lab and/or imaging studies, admission to the hospital is not indicated at this time.    Problems Addressed: Renal colic on left side: acute illness or injury  Amount and/or Complexity of Data Reviewed Labs: ordered. Decision-making details documented in ED Course. Radiology: ordered and independent interpretation performed. Decision-making details documented in ED Course.  Risk Prescription drug management. Parenteral controlled substances. Decision regarding hospitalization.     Final Clinical Impression(s) / ED Diagnoses Final diagnoses:  Renal colic on left side    Rx / DC Orders ED Discharge Orders          Ordered    HYDROcodone -acetaminophen  (NORCO/VICODIN) 5-325 MG tablet  Every 6 hours PRN        09/19/23 0533    ondansetron  (ZOFRAN -ODT) 4 MG disintegrating tablet  Every 8 hours PRN        09/19/23 0533    tamsulosin  (FLOMAX ) 0.4 MG CAPS capsule  Daily        09/19/23 0533             Roselyn Carlin NOVAK, MD 09/19/23 [phone]

## 2023-11-22 IMAGING — CT CT HEAD W/O CM
4 series · 17 of 47 positions shown, 19 images · non-contrast
Comparison: None.

CLINICAL DATA: Status post fall.

EXAM:
CT HEAD WITHOUT CONTRAST
TECHNIQUE: Contiguous axial images were obtained from the base of the skull
through the vertex without intravenous contrast.

[Series 2: head wo · axial · 0.44mm/px · z∈[-203,-78]mm · 7 of 35 slices shown, 9 images]
[im 5/35  brain]
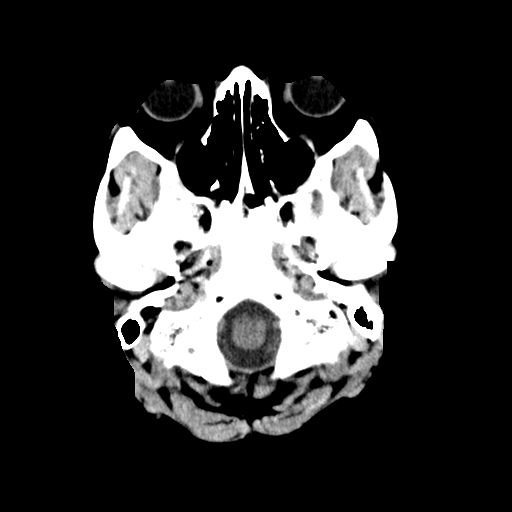
[im 5/35  bone]
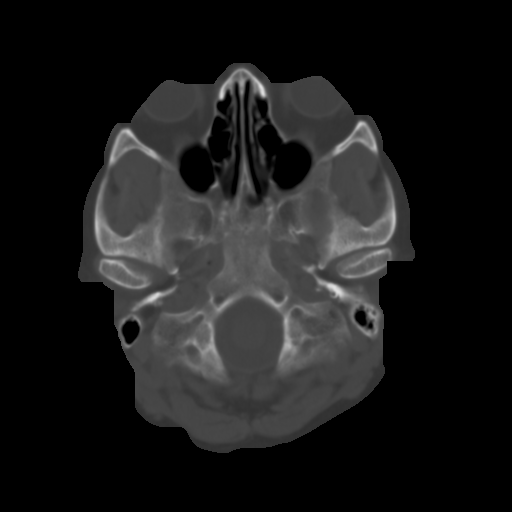
[im 9/35  brain]
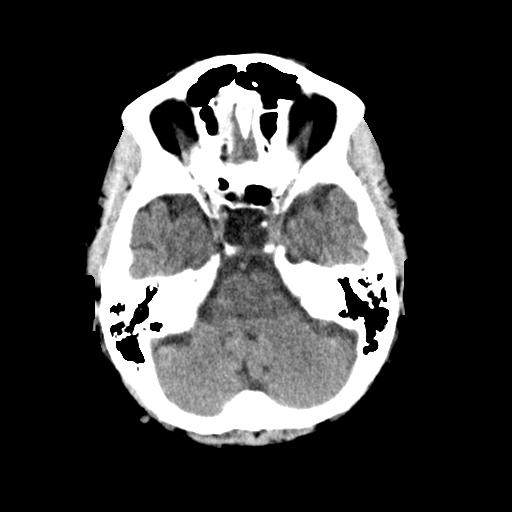
[im 13/35  brain]
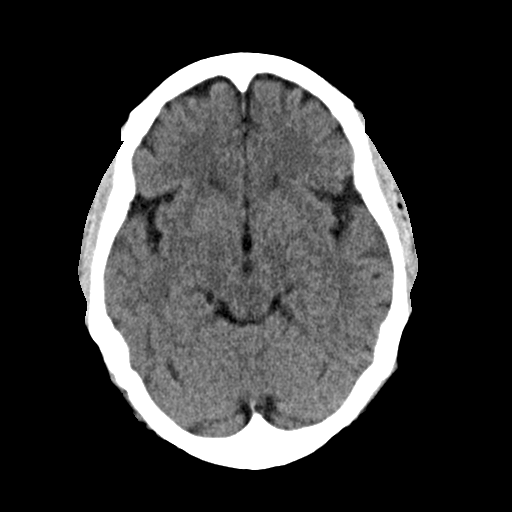
[im 18/35  brain]
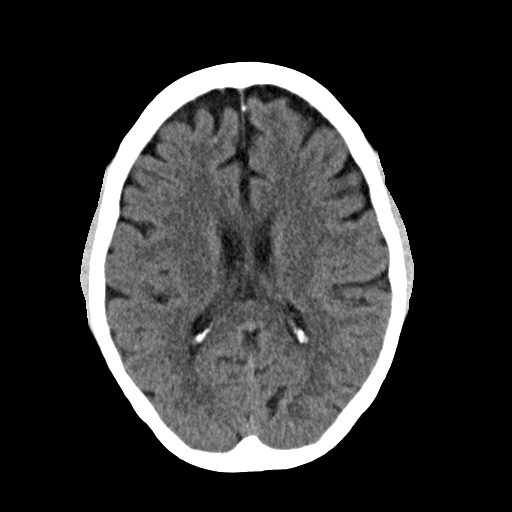
[im 22/35  brain]
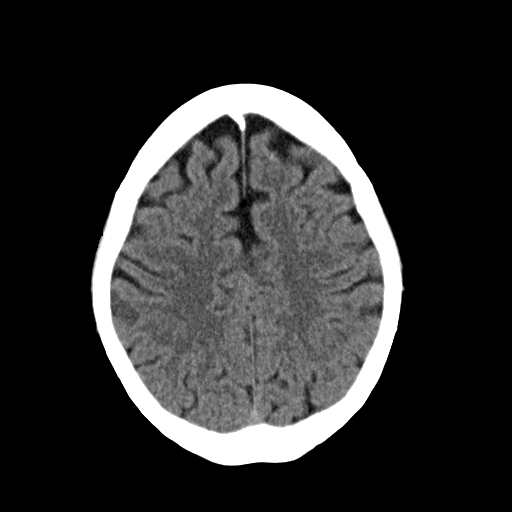
[im 22/35  bone]
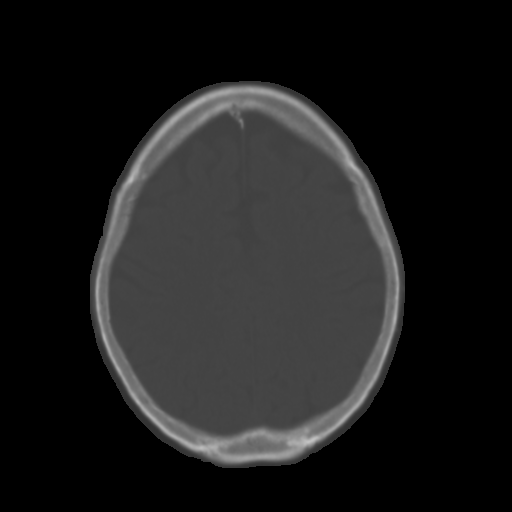
[im 26/35  brain]
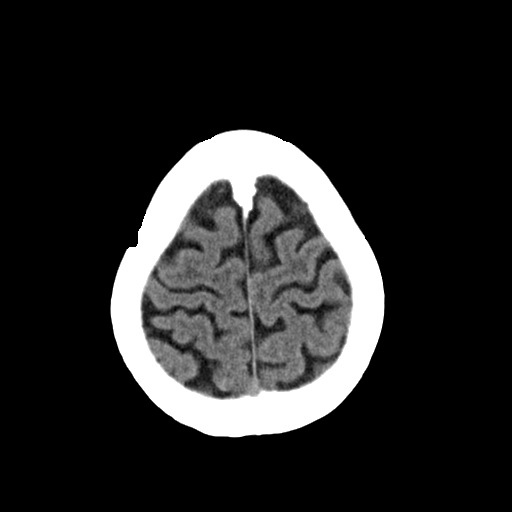
[im 30/35  brain]
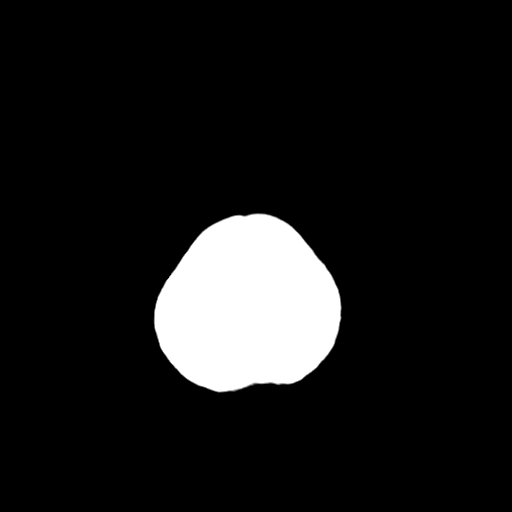

[Series 3: head bone · axial · 0.44mm/px · z∈[-207,-147]mm · 4 of 87 slices shown]
[im 9/87  bone]
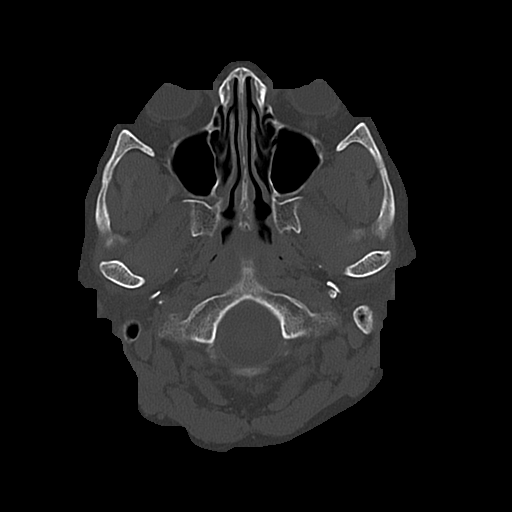
[im 18/87  bone]
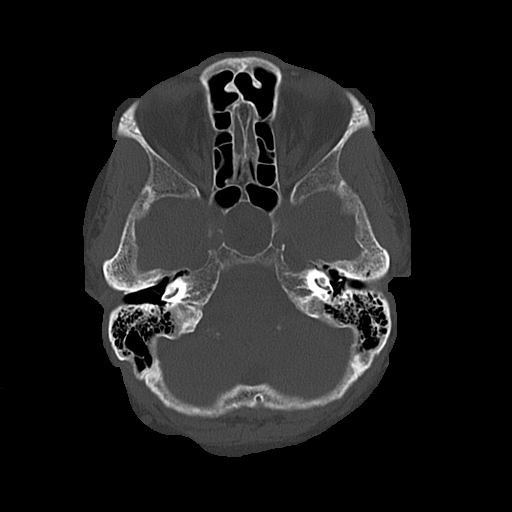
[im 26/87  bone]
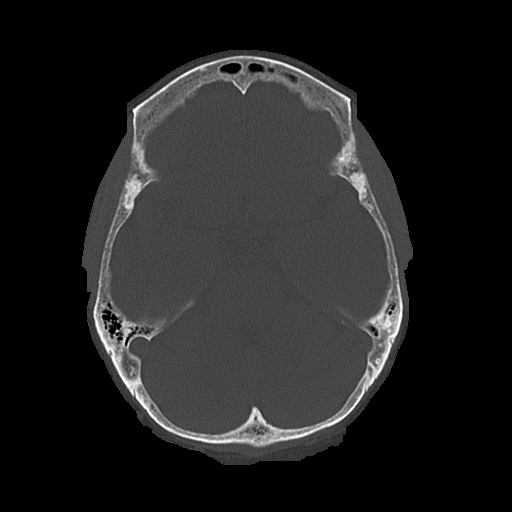
[im 39/87  bone]
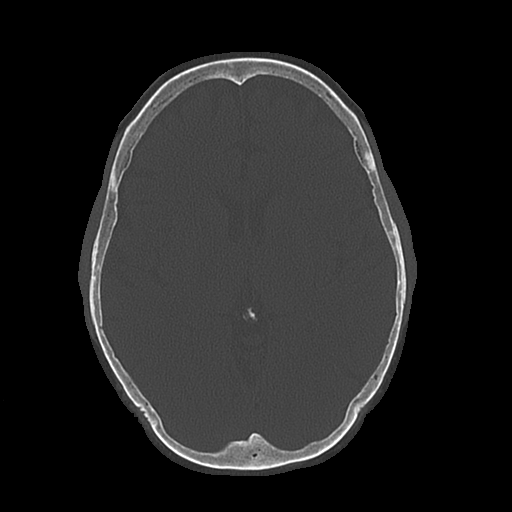

[Series 4: coronal soft · coronal · 0.34mm/px · 3 of 67 slices shown]
[im 23/67  brain]
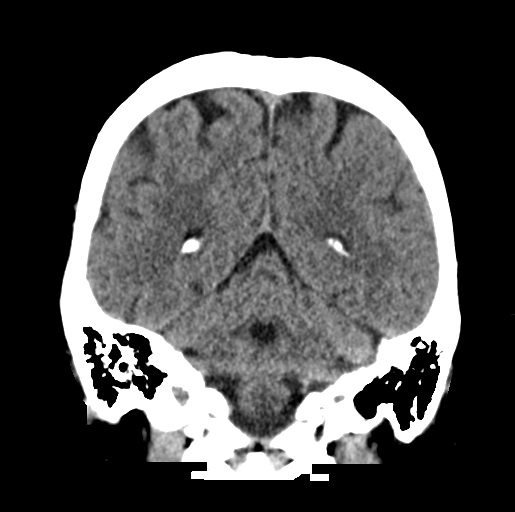
[im 30/67  brain]
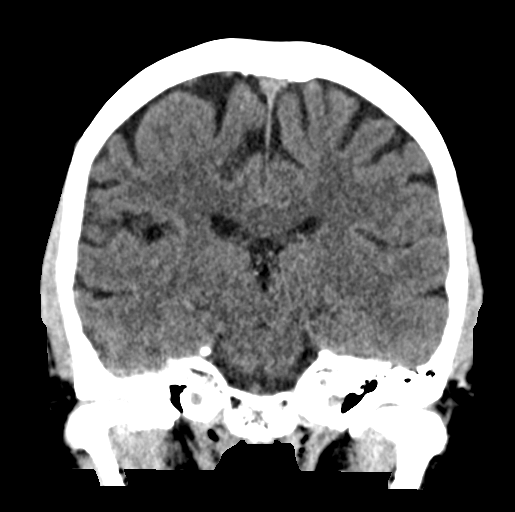
[im 37/67  brain]
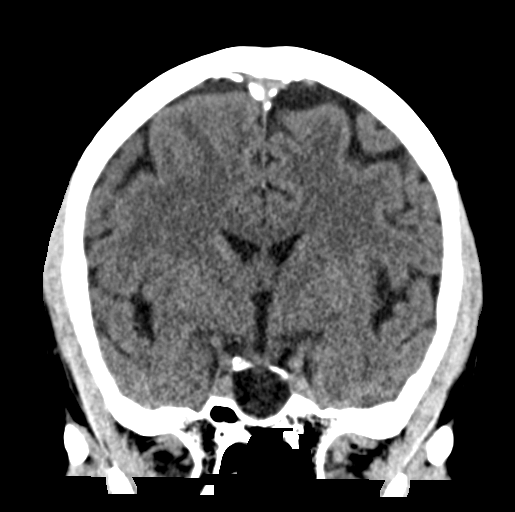

[Series 5: sagittal soft · sagittal · 0.34mm/px · 3 of 59 slices shown]
[im 20/59  brain]
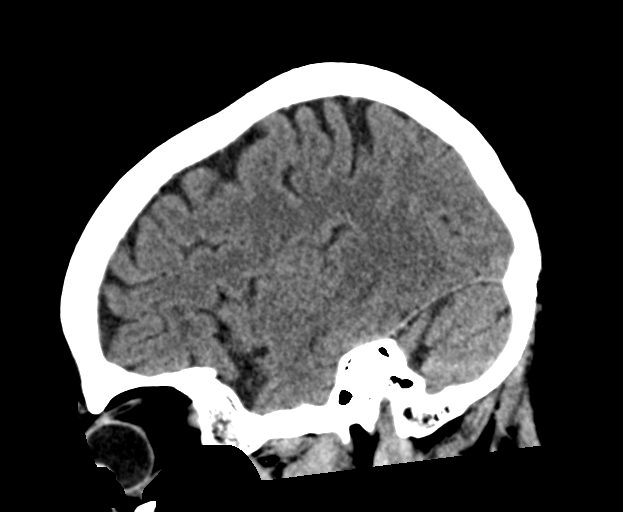
[im 30/59  brain]
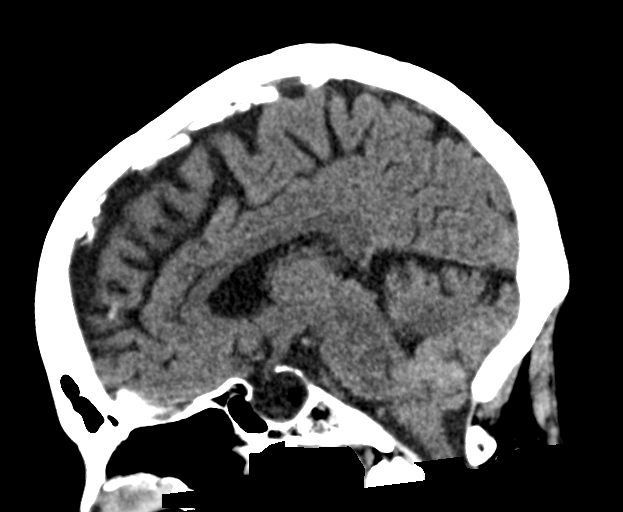
[im 39/59  brain]
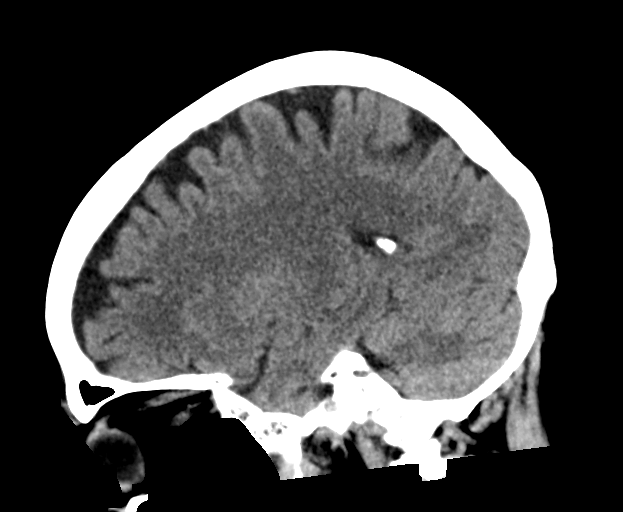

[17 of 47 positions shown; findings below may reference images not displayed]

FINDINGS: Brain: There is mild cerebral atrophy with widening of the
extra-axial spaces and ventricular dilatation.
There are areas of decreased attenuation within the white matter
tracts of the supratentorial brain, consistent with microvascular
disease changes.

Vascular: No hyperdense vessel or unexpected calcification.

Skull: Normal. Negative for fracture or focal lesion.

Sinuses/Orbits: No acute finding.

Other: None.
IMPRESSION: 1. Mild cerebral atrophy and microvascular disease changes of the
supratentorial brain.
2. No acute intracranial abnormality.
